# Patient Record
Sex: Male | Born: 1965 | Race: White | Hispanic: No | Marital: Married | Smoking: Never smoker
Health system: Southern US, Community
[De-identification: ages and names within clinical notes are randomized; demographics above are authoritative.]

## PROBLEM LIST (undated history)

## (undated) DIAGNOSIS — I10 Essential (primary) hypertension: Secondary | ICD-10-CM

## (undated) DIAGNOSIS — K219 Gastro-esophageal reflux disease without esophagitis: Secondary | ICD-10-CM

## (undated) DIAGNOSIS — M5416 Radiculopathy, lumbar region: Secondary | ICD-10-CM

## (undated) DIAGNOSIS — C439 Malignant melanoma of skin, unspecified: Secondary | ICD-10-CM

## (undated) DIAGNOSIS — E785 Hyperlipidemia, unspecified: Secondary | ICD-10-CM

## (undated) DIAGNOSIS — N529 Male erectile dysfunction, unspecified: Secondary | ICD-10-CM

## (undated) DIAGNOSIS — J189 Pneumonia, unspecified organism: Secondary | ICD-10-CM

## (undated) HISTORY — DX: Male erectile dysfunction, unspecified: N52.9

## (undated) HISTORY — PX: WISDOM TOOTH EXTRACTION: SHX21

## (undated) HISTORY — DX: Malignant melanoma of skin, unspecified: C43.9

## (undated) HISTORY — DX: Hyperlipidemia, unspecified: E78.5

## (undated) HISTORY — DX: Essential (primary) hypertension: I10

---

## 1995-12-05 HISTORY — PX: HERNIA REPAIR: SHX51

## 2002-12-04 HISTORY — PX: MELANOMA EXCISION: SHX5266

## 2003-12-05 HISTORY — PX: LIPOMA EXCISION: SHX5283

## 2005-12-04 HISTORY — PX: OTHER SURGICAL HISTORY: SHX169

## 2006-08-27 ENCOUNTER — Ambulatory Visit: Payer: Self-pay | Admitting: Internal Medicine

## 2006-10-08 ENCOUNTER — Ambulatory Visit: Payer: Self-pay | Admitting: Internal Medicine

## 2006-10-08 LAB — CONVERTED CEMR LAB
HDL: 44 mg/dL
Total CHOL/HDL Ratio: 4.9

## 2006-11-07 ENCOUNTER — Encounter: Payer: Self-pay | Admitting: Internal Medicine

## 2006-11-07 DIAGNOSIS — J309 Allergic rhinitis, unspecified: Secondary | ICD-10-CM | POA: Insufficient documentation

## 2006-11-07 DIAGNOSIS — I10 Essential (primary) hypertension: Secondary | ICD-10-CM

## 2006-11-07 DIAGNOSIS — E785 Hyperlipidemia, unspecified: Secondary | ICD-10-CM

## 2007-01-09 ENCOUNTER — Ambulatory Visit: Payer: Self-pay | Admitting: Internal Medicine

## 2007-01-10 ENCOUNTER — Encounter (INDEPENDENT_AMBULATORY_CARE_PROVIDER_SITE_OTHER): Payer: Self-pay | Admitting: Internal Medicine

## 2007-01-10 LAB — CONVERTED CEMR LAB
ALT: 34 units/L (ref 0–53)
AST: 19 units/L (ref 0–37)
Albumin: 4.7 g/dL (ref 3.5–5.2)
Alkaline Phosphatase: 65 units/L (ref 39–117)
Basophils Absolute: 0.1 10*3/uL (ref 0.0–0.1)
Basophils Relative: 1 % (ref 0–1)
Calcium: 9.4 mg/dL (ref 8.4–10.5)
Chloride: 102 meq/L (ref 96–112)
Eosinophils Absolute: 0.2 10*3/uL (ref 0.0–0.7)
MCHC: 33.3 g/dL (ref 30.0–36.0)
MCV: 86.2 fL (ref 78.0–100.0)
Monocytes Relative: 7 % (ref 3–11)
Neutro Abs: 3.9 10*3/uL (ref 1.7–7.7)
Neutrophils Relative %: 57 % (ref 43–77)
Platelets: 223 10*3/uL (ref 150–400)
Potassium: 3.8 meq/L (ref 3.5–5.3)
RBC: 5.36 M/uL (ref 4.22–5.81)
Sodium: 140 meq/L (ref 135–145)
Total Protein: 7.4 g/dL (ref 6.0–8.3)

## 2007-01-28 ENCOUNTER — Telehealth (INDEPENDENT_AMBULATORY_CARE_PROVIDER_SITE_OTHER): Payer: Self-pay | Admitting: *Deleted

## 2007-04-09 ENCOUNTER — Encounter (INDEPENDENT_AMBULATORY_CARE_PROVIDER_SITE_OTHER): Payer: Self-pay | Admitting: Internal Medicine

## 2007-04-10 ENCOUNTER — Ambulatory Visit: Payer: Self-pay | Admitting: Internal Medicine

## 2007-04-10 DIAGNOSIS — F528 Other sexual dysfunction not due to a substance or known physiological condition: Secondary | ICD-10-CM

## 2007-07-12 ENCOUNTER — Ambulatory Visit: Payer: Self-pay | Admitting: Internal Medicine

## 2007-07-14 LAB — CONVERTED CEMR LAB
Albumin: 4.4 g/dL (ref 3.5–5.2)
BUN: 18 mg/dL (ref 6–23)
CO2: 24 meq/L (ref 19–32)
Cholesterol: 152 mg/dL (ref 0–200)
Eosinophils Absolute: 0.2 10*3/uL (ref 0.0–0.7)
Eosinophils Relative: 4 % (ref 0–5)
Glucose, Bld: 85 mg/dL (ref 70–99)
HCT: 47.3 % (ref 39.0–52.0)
HDL: 48 mg/dL (ref >39)
Hemoglobin: 16.4 g/dL (ref 13.0–17.0)
Lymphocytes Relative: 32 % (ref 12–46)
Lymphs Abs: 1.7 10*3/uL (ref 0.7–3.3)
MCV: 85.2 fL (ref 78.0–100.0)
Monocytes Relative: 8 % (ref 3–11)
Platelets: 212 10*3/uL (ref 150–400)
RBC: 5.55 M/uL (ref 4.22–5.81)
Sodium: 142 meq/L (ref 135–145)
Total Bilirubin: 0.7 mg/dL (ref 0.3–1.2)
Total Protein: 7 g/dL (ref 6.0–8.3)
Triglycerides: 82 mg/dL (ref <150)
WBC: 5.4 10*3/uL (ref 4.0–10.5)

## 2007-07-15 ENCOUNTER — Telehealth (INDEPENDENT_AMBULATORY_CARE_PROVIDER_SITE_OTHER): Payer: Self-pay | Admitting: *Deleted

## 2007-07-15 ENCOUNTER — Encounter (INDEPENDENT_AMBULATORY_CARE_PROVIDER_SITE_OTHER): Payer: Self-pay | Admitting: Internal Medicine

## 2009-04-12 ENCOUNTER — Encounter: Payer: Self-pay | Admitting: Internal Medicine

## 2009-05-17 ENCOUNTER — Ambulatory Visit: Payer: Self-pay | Admitting: Internal Medicine

## 2009-05-17 DIAGNOSIS — R9431 Abnormal electrocardiogram [ECG] [EKG]: Secondary | ICD-10-CM

## 2011-05-29 ENCOUNTER — Encounter: Payer: Self-pay | Admitting: Internal Medicine

## 2013-04-04 ENCOUNTER — Ambulatory Visit (INDEPENDENT_AMBULATORY_CARE_PROVIDER_SITE_OTHER): Payer: BC Managed Care – PPO | Admitting: Urology

## 2013-04-04 DIAGNOSIS — R3129 Other microscopic hematuria: Secondary | ICD-10-CM

## 2013-07-18 ENCOUNTER — Ambulatory Visit (INDEPENDENT_AMBULATORY_CARE_PROVIDER_SITE_OTHER): Payer: BC Managed Care – PPO | Admitting: Urology

## 2013-07-18 ENCOUNTER — Other Ambulatory Visit: Payer: Self-pay | Admitting: Urology

## 2013-07-18 DIAGNOSIS — R319 Hematuria, unspecified: Secondary | ICD-10-CM

## 2013-07-18 DIAGNOSIS — R3129 Other microscopic hematuria: Secondary | ICD-10-CM

## 2013-08-01 ENCOUNTER — Ambulatory Visit (HOSPITAL_COMMUNITY)
Admission: RE | Admit: 2013-08-01 | Discharge: 2013-08-01 | Disposition: A | Discharge status: Home / Self Care | Payer: BC Managed Care – PPO | Source: Ambulatory Visit | Attending: Urology | Admitting: Urology

## 2013-08-01 DIAGNOSIS — K5730 Diverticulosis of large intestine without perforation or abscess without bleeding: Secondary | ICD-10-CM | POA: Insufficient documentation

## 2013-08-01 DIAGNOSIS — K8020 Calculus of gallbladder without cholecystitis without obstruction: Secondary | ICD-10-CM | POA: Insufficient documentation

## 2013-08-01 DIAGNOSIS — R319 Hematuria, unspecified: Secondary | ICD-10-CM

## 2013-08-01 DIAGNOSIS — Z85820 Personal history of malignant melanoma of skin: Secondary | ICD-10-CM | POA: Insufficient documentation

## 2013-08-01 MED ORDER — IOHEXOL 300 MG/ML  SOLN
125.0000 mL | Freq: Once | INTRAMUSCULAR | Status: AC | PRN
  Administered 2013-08-01: 125 mL via INTRAVENOUS

## 2013-08-15 ENCOUNTER — Ambulatory Visit (INDEPENDENT_AMBULATORY_CARE_PROVIDER_SITE_OTHER): Payer: BC Managed Care – PPO | Admitting: Urology

## 2013-08-15 DIAGNOSIS — R3129 Other microscopic hematuria: Secondary | ICD-10-CM

## 2013-08-15 DIAGNOSIS — N281 Cyst of kidney, acquired: Secondary | ICD-10-CM

## 2014-04-28 IMAGING — CT CT ABD-PEL WO/W CM
3 of 10 series · 12 of 46 positions shown, 18 images · IV contrast (Omnipaque 300)
Comparison: None.

CLINICAL DATA: Hematuria.  Personal history of melanoma.

EXAM:
CT ABDOMEN AND PELVIS WITHOUT AND WITH CONTRAST
TECHNIQUE: Multidetector CT imaging of the abdomen and pelvis was performed
without contrast material in one or both body regions, followed by
contrast material(s) and further sections in one or both body
regions.
CONTRAST:  125mL OMNIPAQUE IOHEXOL 300 MG/ML  SOLN

[Series 2: hematuria pre 5.0 b40f · axial · non-contrast · 0.71mm/px · z∈[-450,-80]mm · 6 of 104 slices shown, 11 images]
[im 15/104  soft-tissue]
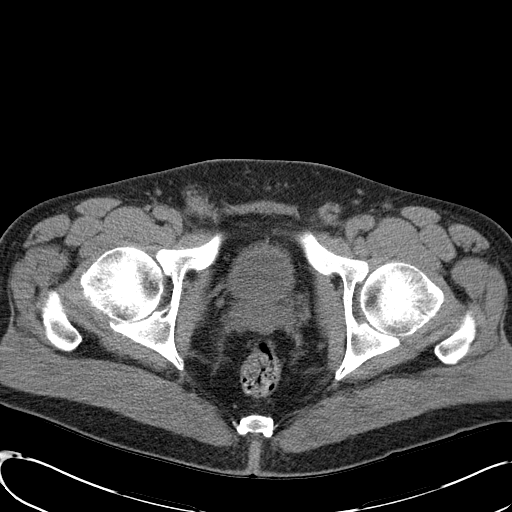
[im 15/104  bone]
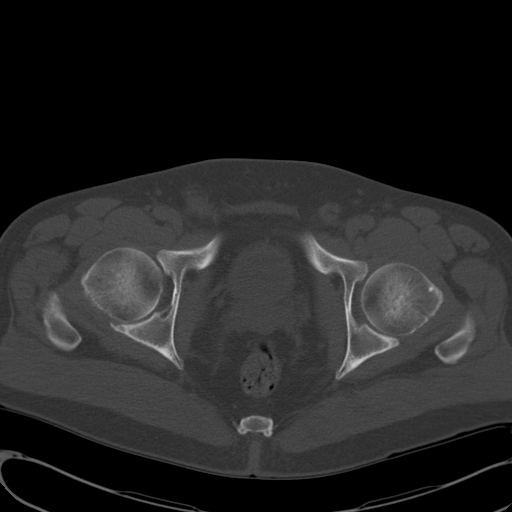
[im 30/104  soft-tissue]
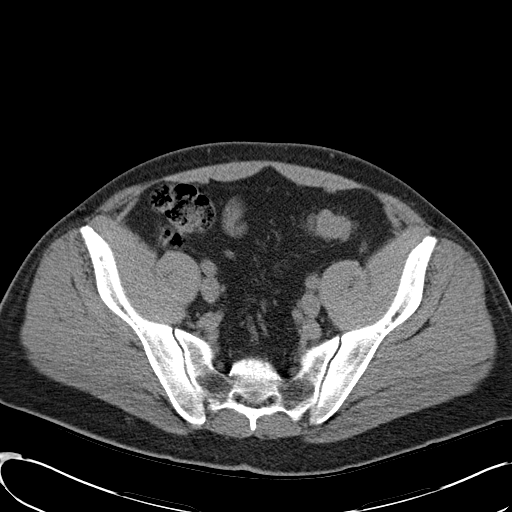
[im 45/104  soft-tissue]
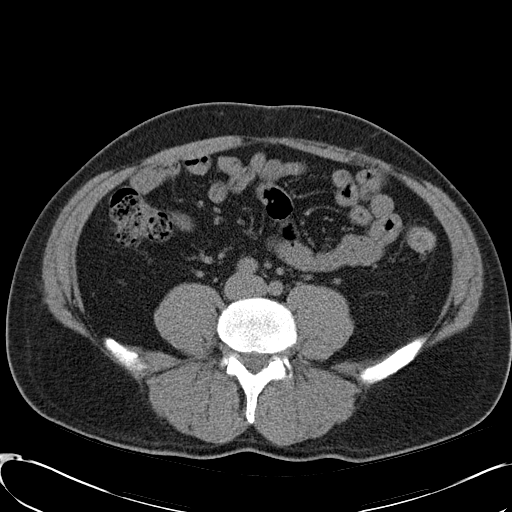
[im 45/104  lung]
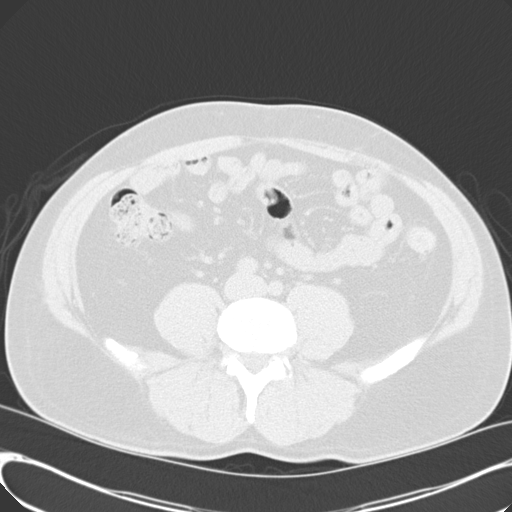
[im 59/104  soft-tissue]
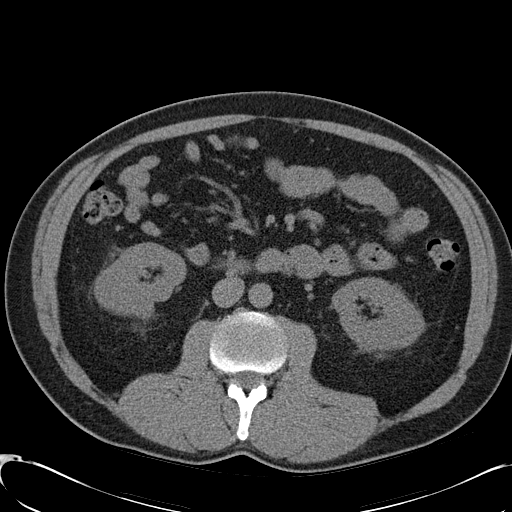
[im 59/104  lung]
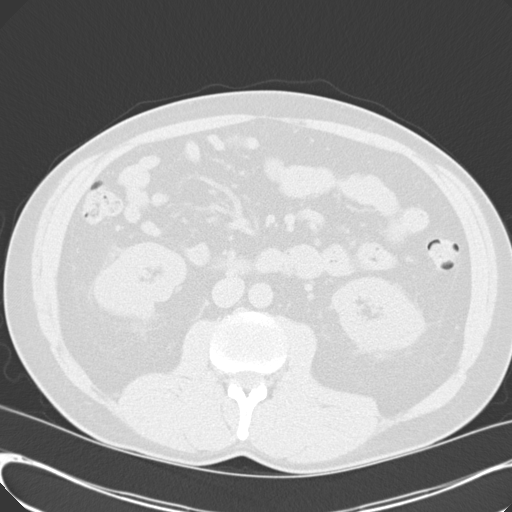
[im 74/104  soft-tissue]
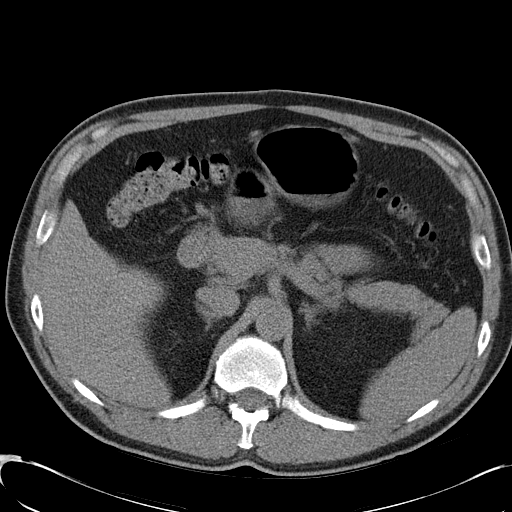
[im 74/104  lung]
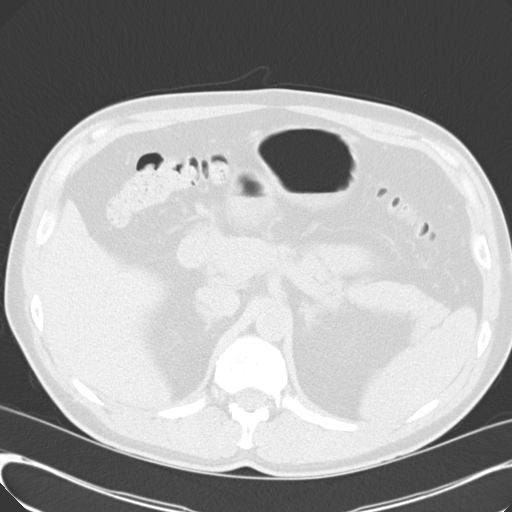
[im 89/104  soft-tissue]
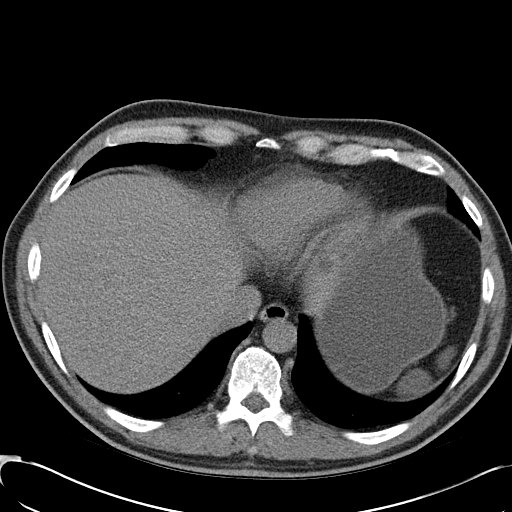
[im 89/104  lung]
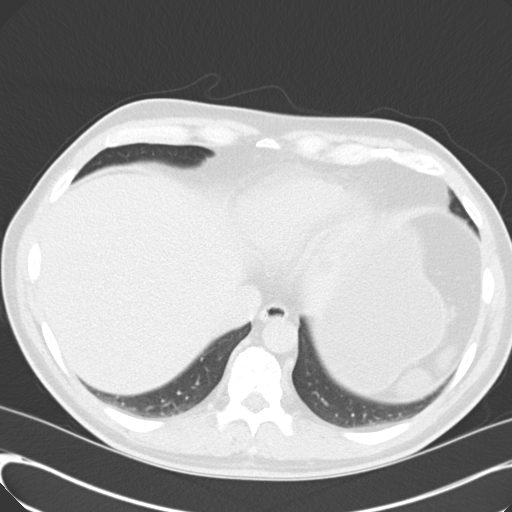

[Series 3: hematuria mpr pre coronal 3.0 · coronal · non-contrast · 0.73mm/px · 2 of 95 slices shown, 3 images]
[im 32/95  soft-tissue]
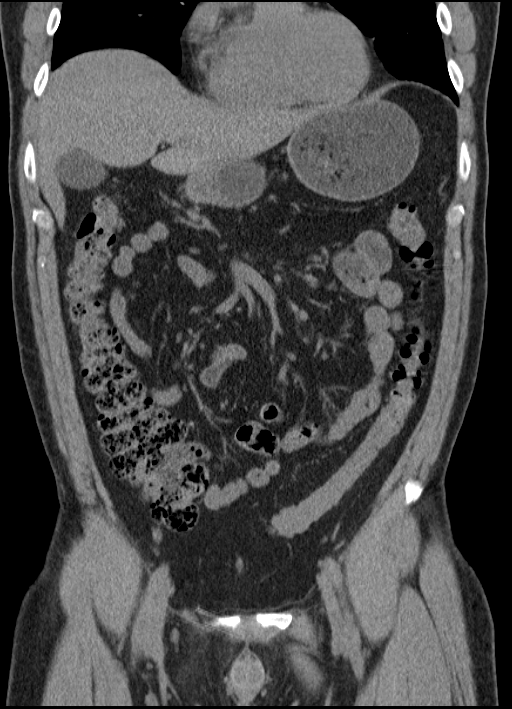
[im 32/95  bone]
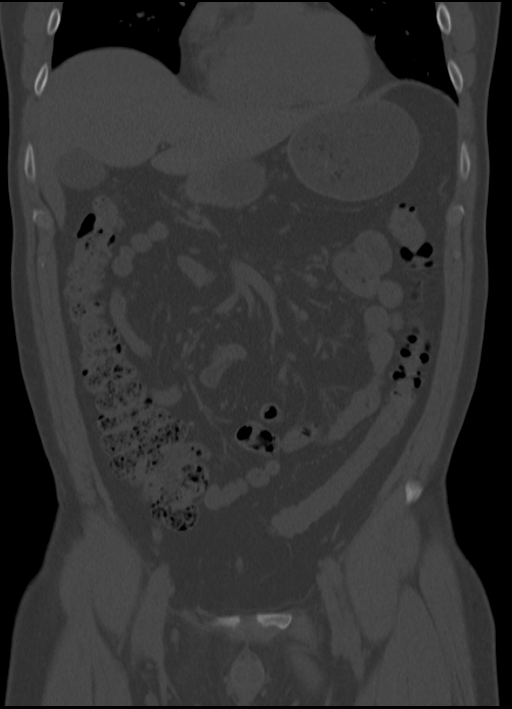
[im 63/95  soft-tissue]
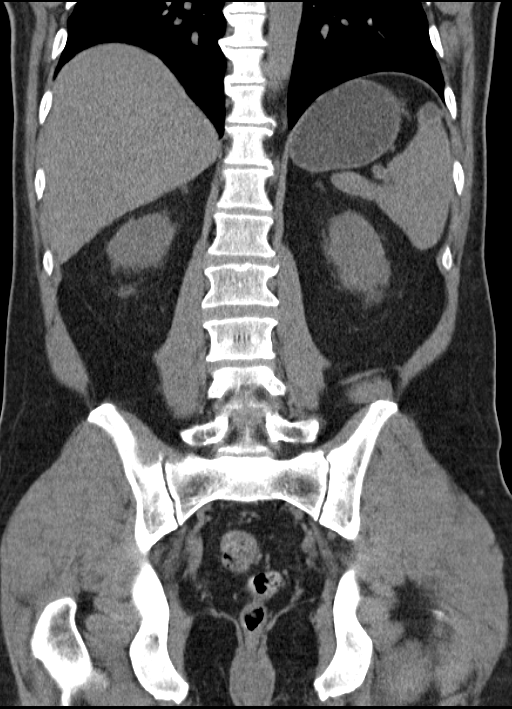

[Series 6: hematuria post axial 5.0 b40f · axial · 0.71mm/px · z∈[-455,-230]mm · 4 of 105 slices shown]
[im 15/105  soft-tissue]
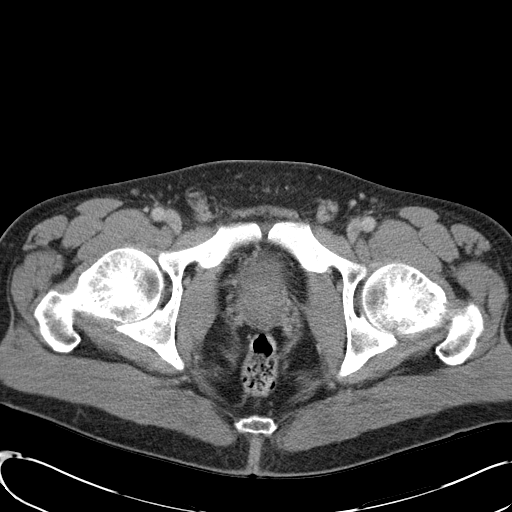
[im 30/105  soft-tissue]
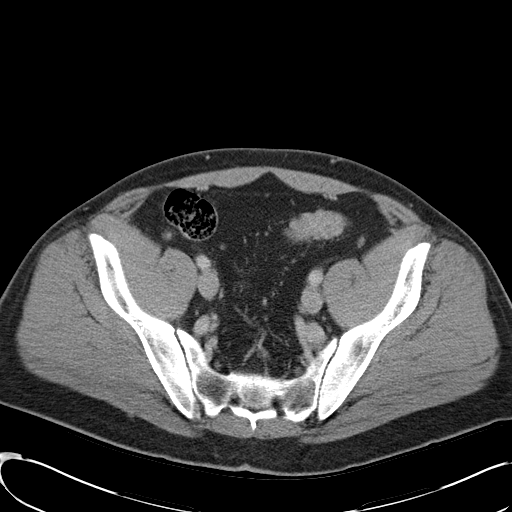
[im 45/105  soft-tissue]
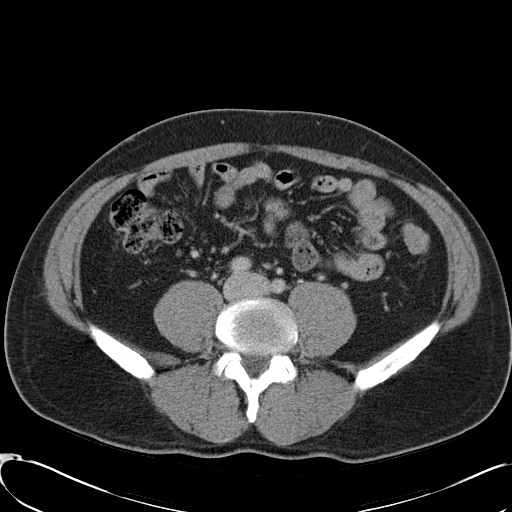
[im 60/105  soft-tissue]
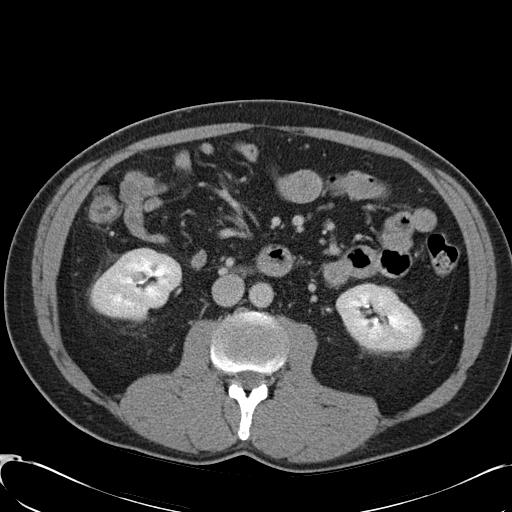

[12 of 46 positions shown; findings below may reference images not displayed]

FINDINGS: No evidence of renal calculi or hydronephrosis. No evidence of
ureteral calculi or dilatation. No bladder calculi identified.
Incidental note is made of tiny gallstones in the gallbladder neck,
without evidence of cholecystitis.

Postcontrast phases show a simple subcapsular cyst in the lower pole
of the left kidney which measures 1.8 cm. No complex cystic or solid
renal masses are identified. Excretory phase shows no evidence of
masses or filling defects involving the renal collecting systems,
opacified portions of ureters, or urinary bladder. Mild median lobe
hypertrophy of prostate causes mild mass effect on bladder base.

A 1 cm benign appearing cyst is seen in the superior lateral segment
of the left hepatic lobe on image 15, and other small benign
appearing cysts are also seen in the superior aspect of the spleen.
No evidence of splenomegaly. The pancreas and adrenal glands are
normal in appearance.

No other soft tissue masses or lymphadenopathy identified within the
abdomen or pelvis. No evidence of inflammatory process or abnormal
fluid collections. No evidence of dilated bowel loops or hernia.
Mild diverticulosis is seen involving the left colon. No evidence of
diverticulitis.
IMPRESSION: No evidence of urinary tract neoplasm, urolithiasis, or
hydronephrosis.

Cholelithiasis and diverticulosis incidentally noted.

## 2014-11-12 ENCOUNTER — Telehealth: Payer: Self-pay | Admitting: Gastroenterology

## 2014-11-12 NOTE — Telephone Encounter (Signed)
PT WILL CALL FOR APPT FOR TCS-PREPOPIK AFTER Dec 14, 2014. PT MAY WANT OPV PRIOR TO TCS.

## 2014-11-18 ENCOUNTER — Telehealth: Payer: Self-pay

## 2014-11-18 NOTE — Telephone Encounter (Addendum)
Gastroenterology Pre-Procedure Review  Request Date: Requesting Physician: Modena Morrow  PATIENT REVIEW QUESTIONS: The patient responded to the following health history questions as indicated:    1. Diabetes Melitis: NO 2. Joint replacements in the past 12 months: NO 3. Major health problems in the past 3 months: NO 4. Has an artificial valve or MVP: NO 5. Has a defibrillator: NO 6. Has been advised in past to take antibiotics in advance of a procedure like teeth cleaning: NO 7.Family History Colon Cancer: NO 8. Alcohol:  1 BEER A MONTH    MEDICATIONS & ALLERGIES:    Patient reports the following regarding taking any blood thinners:   Plavix? NO Aspirin? YES Coumadin? NO  Patient confirms/reports the following medications:  Current Outpatient Prescriptions  Medication Sig Dispense Refill  . atorvastatin (LIPITOR) 10 MG tablet Take 10 mg by mouth daily.      Marland Kitchen desloratadine (CLARINEX) 5 MG tablet Take 5 mg by mouth daily as needed.      Marland Kitchen losartan-hydrochlorothiazide (HYZAAR) 50-12.5 MG per tablet Take 1 tablet by mouth daily.      . mometasone (NASONEX) 50 MCG/ACT nasal spray Place 2 sprays into the nose as needed.      . Multiple Vitamin (MULTIVITAMIN) tablet Take 1 tablet by mouth. Every now and then     . vardenafil (LEVITRA) 10 MG tablet Take 10 mg by mouth daily as needed.       No current facility-administered medications for this visit.    Patient confirms/reports the following allergies:  Allergies not on file  No orders of the defined types were placed in this encounter.    AUTHORIZATION INFORMATION Primary Insurance: [place],  [place] #: [id number]  Group #:  Pre-Cert / Josem Kaufmann required:  Pre-Cert / Auth #:    SCHEDULE INFORMATION: Procedure has been scheduled as follows:  Date: , Time:  Location:   This Gastroenterology Pre-Precedure Review Form is being routed to the following provider(s):

## 2014-11-18 NOTE — Addendum Note (Signed)
Addended by: Marlou Porch on: 11/18/2014 12:00 PM   Modules accepted: Medications

## 2014-11-18 NOTE — Telephone Encounter (Signed)
PATIENT TURNING 49, NO PROBLEMS. WIFE CALLED TO SCHEDULE HIM A COLONOSCOPY.   WANTS TO BE A FIELDS PATIENT  [phone] OR [phone](HOME)

## 2014-11-19 NOTE — Addendum Note (Signed)
Addended by: Marlou Porch on: 11/19/2014 08:50 AM   Modules accepted: Medications

## 2014-11-20 NOTE — Telephone Encounter (Signed)
Called and left message on machine to call back. I also called [employer] and talked with Didi F. He does not need a PA for the TCS.

## 2014-11-20 NOTE — Telephone Encounter (Signed)
Appropriate.

## 2014-11-30 NOTE — Telephone Encounter (Signed)
Pt is wanting to wait until February, but he will call back with some dates

## 2015-01-27 ENCOUNTER — Ambulatory Visit (INDEPENDENT_AMBULATORY_CARE_PROVIDER_SITE_OTHER): Payer: BLUE CROSS/BLUE SHIELD | Admitting: Gastroenterology

## 2015-01-27 ENCOUNTER — Other Ambulatory Visit: Payer: Self-pay

## 2015-01-27 ENCOUNTER — Encounter: Payer: Self-pay | Admitting: Gastroenterology

## 2015-01-27 VITALS — BP 139/85 | HR 67 | Temp 98.1°F | Ht 73.0 in | Wt 211.0 lb

## 2015-01-27 DIAGNOSIS — K2270 Barrett's esophagus without dysplasia: Secondary | ICD-10-CM

## 2015-01-27 DIAGNOSIS — Z1211 Encounter for screening for malignant neoplasm of colon: Secondary | ICD-10-CM

## 2015-01-27 NOTE — Progress Notes (Signed)
   Subjective:    Patient ID: Logan Hodge, male    DOB: 02/25/1966, 49 y.o.   MRN: [id number]  Florina Ou, MD  HPI Brother had polyps. Turned 49 and need colonoscopy. BMs: DAILY.  PT DENIES FEVER, CHILLS, HEMATOCHEZIA, nausea, vomiting, melena, diarrhea, CHEST PAIN, SHORTNESS OF BREATH,  CHANGE IN BOWEL IN HABITS, constipation, abdominal pain, problems swallowing, problems with sedation, OR heartburn or indigestion.  Past Medical History  Diagnosis Date  . Allergic rhinitis   . HLD (hyperlipidemia)   . HTN (hypertension)   . Melanoma     hx  . ED (erectile dysfunction)    Past Surgical History  Procedure Laterality Date  . Lasik  2007  . Hernia repair  1997  . Lipoma excision  2005    L arm  . Melanoma excision  2004    right shoulder    No Known Allergies  Current Outpatient Prescriptions  Medication Sig Dispense Refill  . amLODipine (NORVASC) 2.5 MG tablet Take 2.5 mg by mouth daily.    Marland Kitchen atorvastatin (LIPITOR) 10 MG tablet Take 10 mg by mouth daily.      Marland Kitchen desloratadine (CLARINEX) 5 MG tablet Take 5 mg by mouth daily as needed.      Marland Kitchen losartan-hydrochlorothiazide (HYZAAR) 50-12.5 MG per tablet Take 1 tablet by mouth daily.      Marland Kitchen UNABLE TO FIND Althagan-P 1 drop in each eye as needed     Family History  Problem Relation Age of Onset  . Colon polyps Brother   . Colon cancer Neg Hx   . Stomach cancer Neg Hx     History  Substance Use Topics  . Smoking status: Never Smoker   . Smokeless tobacco: Not on file  . Alcohol Use: No      Review of Systems PER HPI OTHERWISE ALL SYSTEMS ARE NEGATIVE.    Objective:   Physical Exam  Constitutional: He is oriented to person, place, and time. He appears well-developed and well-nourished. No distress.  HENT:  Head: Normocephalic and atraumatic.  Mouth/Throat: Oropharynx is clear and moist. No oropharyngeal exudate.  Eyes: Pupils are equal, round, and reactive to light. No scleral icterus.  Neck: Normal range of  motion. Neck supple.  Cardiovascular: Normal rate, regular rhythm and normal heart sounds.   Pulmonary/Chest: Effort normal and breath sounds normal. No respiratory distress.  Abdominal: Soft. Bowel sounds are normal. He exhibits no distension. There is no tenderness.  Musculoskeletal: He exhibits no edema.  Lymphadenopathy:    He has no cervical adenopathy.  Neurological: He is alert and oriented to person, place, and time.  NO FOCAL DEFICITS   Psychiatric: He has a normal mood and affect.  Vitals reviewed.         Assessment & Plan:

## 2015-01-27 NOTE — Assessment & Plan Note (Signed)
High risk-brother with large polyps  TCS FOR SCREENING EGD FOR BARRETT'S SURVEILLANCE OPV TBS SCHEDULED AFTER ENDOSCOPY.

## 2015-01-27 NOTE — Patient Instructions (Signed)
MAR 24: FULL LIQUID DIET. SEE INFO BELOW.  MAR 25:COLONSCOPY TO SCREEN FOR COLON POLYPS. UPPER ENDOSCOPY TO SCREEN FOR BARRETT'S ESOPHAGUS.   Full Liquid Diet A high-calorie, high-protein supplement should be used to meet your nutritional requirements when the full liquid diet is continued for more than 2 or 3 days. If this diet is to be used for an extended period of time (more than 7 days), a multivitamin should be considered.  Breads and Starches  Allowed: None are allowed except crackers WHOLE OR pureed (made into a thick, smooth soup) in soup.   Avoid: Any others.    Potatoes/Pasta/Rice  Allowed: ANY ITEM AS A SOUP OR SMALL PLATE OF MASHED POTATOES.       Vegetables  Allowed: Strained tomato or vegetable juice. Vegetables pureed in soup.   Avoid: Any others.    Fruit  Allowed: Any strained fruit juices and fruit drinks. Include 1 serving of citrus or vitamin C-enriched fruit juice daily.   Avoid: Any others.  Meat and Meat Substitutes  Allowed: Egg  Avoid: Any meat, fish, or fowl. All cheese.  Milk  Allowed: Milk beverages, including milk shakes and instant breakfast mixes. Smooth yogurt.   Avoid: Any others. Avoid dairy products if not tolerated.    Soups and Combination Foods  Allowed: Broth, strained cream soups. Strained, broth-based soups.   Avoid: Any others.    Desserts and Sweets  Allowed: flavored gelatin,plain ice cream, sherbet, smooth pudding, junket, fruit ices, frozen ice pops, pudding pops,, frozen fudge pops, chocolate syrup. Sugar, honey, jelly, syrup.   Avoid: Any others.  Fats and Oils  Allowed: Margarine, butter, cream, sour cream, oils.   Avoid: Any others.  Beverages  Allowed: All.   Avoid: None.  Condiments  Allowed: Iodized salt, pepper, spices, flavorings. Cocoa powder.   Avoid: Any others.    SAMPLE MEAL PLAN Breakfast   cup orange juice.   1 OR 2 EGGS   1 cup  milk.   1 cup beverage (coffee or tea).    Cream or sugar, if desired.    Midmorning Snack  2 SCRAMBLED OR HARD BOILED EGG   Lunch  1 cup cream soup.    cup fruit juice.   1 cup milk.    cup custard.   1 cup beverage (coffee or tea).   Cream or sugar, if desired.    Midafternoon Snack  1 cup milk shake.  Dinner  1 cup cream soup.    cup fruit juice.   1 cup milk.    cup pudding.   1 cup beverage (coffee or tea).   Cream or sugar, if desired.  Evening Snack  1 cup supplement.  To increase calories, add sugar, cream, butter, or margarine if possible. Nutritional supplements will also increase the total calories.

## 2015-02-02 NOTE — Progress Notes (Signed)
CC'ED TO PCP 

## 2015-02-25 ENCOUNTER — Encounter (HOSPITAL_COMMUNITY)
Admission: RE | Disposition: A | Discharge status: Home / Self Care | Payer: Self-pay | Source: Ambulatory Visit | Attending: Gastroenterology

## 2015-02-25 ENCOUNTER — Encounter (HOSPITAL_COMMUNITY): Payer: Self-pay | Admitting: *Deleted

## 2015-02-25 ENCOUNTER — Ambulatory Visit (HOSPITAL_COMMUNITY)
Admission: RE | Admit: 2015-02-25 | Discharge: 2015-02-25 | Disposition: A | Discharge status: Home / Self Care | Payer: BLUE CROSS/BLUE SHIELD | Source: Ambulatory Visit | Attending: Gastroenterology | Admitting: Gastroenterology

## 2015-02-25 DIAGNOSIS — N529 Male erectile dysfunction, unspecified: Secondary | ICD-10-CM | POA: Diagnosis not present

## 2015-02-25 DIAGNOSIS — Z85820 Personal history of malignant melanoma of skin: Secondary | ICD-10-CM | POA: Diagnosis not present

## 2015-02-25 DIAGNOSIS — D124 Benign neoplasm of descending colon: Secondary | ICD-10-CM | POA: Diagnosis not present

## 2015-02-25 DIAGNOSIS — K2270 Barrett's esophagus without dysplasia: Secondary | ICD-10-CM

## 2015-02-25 DIAGNOSIS — I10 Essential (primary) hypertension: Secondary | ICD-10-CM | POA: Insufficient documentation

## 2015-02-25 DIAGNOSIS — K219 Gastro-esophageal reflux disease without esophagitis: Secondary | ICD-10-CM | POA: Insufficient documentation

## 2015-02-25 DIAGNOSIS — K5730 Diverticulosis of large intestine without perforation or abscess without bleeding: Secondary | ICD-10-CM | POA: Diagnosis not present

## 2015-02-25 DIAGNOSIS — Z1211 Encounter for screening for malignant neoplasm of colon: Secondary | ICD-10-CM | POA: Diagnosis not present

## 2015-02-25 DIAGNOSIS — K2980 Duodenitis without bleeding: Secondary | ICD-10-CM | POA: Insufficient documentation

## 2015-02-25 DIAGNOSIS — Z79899 Other long term (current) drug therapy: Secondary | ICD-10-CM | POA: Diagnosis not present

## 2015-02-25 DIAGNOSIS — E785 Hyperlipidemia, unspecified: Secondary | ICD-10-CM | POA: Diagnosis not present

## 2015-02-25 DIAGNOSIS — K644 Residual hemorrhoidal skin tags: Secondary | ICD-10-CM | POA: Insufficient documentation

## 2015-02-25 DIAGNOSIS — Z8371 Family history of colonic polyps: Secondary | ICD-10-CM | POA: Diagnosis not present

## 2015-02-25 DIAGNOSIS — K259 Gastric ulcer, unspecified as acute or chronic, without hemorrhage or perforation: Secondary | ICD-10-CM | POA: Insufficient documentation

## 2015-02-25 HISTORY — PX: COLONOSCOPY: SHX5424

## 2015-02-25 HISTORY — PX: ESOPHAGOGASTRODUODENOSCOPY: SHX5428

## 2015-02-25 SURGERY — COLONOSCOPY
Anesthesia: Moderate Sedation

## 2015-02-25 MED ORDER — STERILE WATER FOR IRRIGATION IR SOLN
Status: DC | PRN
  Administered 2015-02-25: 09:00:00

## 2015-02-25 MED ORDER — MIDAZOLAM HCL 5 MG/5ML IJ SOLN
INTRAMUSCULAR | Status: AC
  Filled 2015-02-25: qty 10

## 2015-02-25 MED ORDER — MEPERIDINE HCL 100 MG/ML IJ SOLN
INTRAMUSCULAR | Status: AC
  Filled 2015-02-25: qty 2

## 2015-02-25 MED ORDER — LIDOCAINE VISCOUS 2 % MT SOLN
OROMUCOSAL | Status: AC
  Filled 2015-02-25: qty 15

## 2015-02-25 MED ORDER — LIDOCAINE VISCOUS 2 % MT SOLN
OROMUCOSAL | Status: DC | PRN
  Administered 2015-02-25: 1 via OROMUCOSAL

## 2015-02-25 MED ORDER — OMEPRAZOLE 20 MG PO CPDR: DELAYED_RELEASE_CAPSULE | ORAL | Status: DC

## 2015-02-25 MED ORDER — MIDAZOLAM HCL 5 MG/5ML IJ SOLN
INTRAMUSCULAR | Status: DC | PRN
  Administered 2015-02-25 (×2): 2 mg via INTRAVENOUS
  Administered 2015-02-25: 1 mg via INTRAVENOUS
  Administered 2015-02-25: 2 mg via INTRAVENOUS
  Administered 2015-02-25: 1 mg via INTRAVENOUS
  Administered 2015-02-25: 2 mg via INTRAVENOUS

## 2015-02-25 MED ORDER — MEPERIDINE HCL 100 MG/ML IJ SOLN
INTRAMUSCULAR | Status: DC | PRN
  Administered 2015-02-25: 25 mg via INTRAVENOUS
  Administered 2015-02-25: 50 mg via INTRAVENOUS
  Administered 2015-02-25 (×3): 25 mg via INTRAVENOUS

## 2015-02-25 MED ORDER — SODIUM CHLORIDE 0.9 % IV SOLN
INTRAVENOUS | Status: DC
  Administered 2015-02-25: 09:00:00 via INTRAVENOUS

## 2015-02-25 NOTE — Interval H&P Note (Signed)
History and Physical Interval Note:  02/25/2015 9:11 AM  Logan Hodge  has presented today for surgery, with the diagnosis of screen for barretts and colon cancer  The various methods of treatment have been discussed with the patient and family. After consideration of risks, benefits and other options for treatment, the patient has consented to  Procedure(s) with comments: COLONOSCOPY (N/A) - 915am - moved to 3/24 same time - Candy notified pt ESOPHAGOGASTRODUODENOSCOPY (EGD) (N/A) as a surgical intervention .  The patient's history has been reviewed, patient examined, no change in status, stable for surgery.  I have reviewed the patient's chart and labs.  Questions were answered to the patient's satisfaction.     [employer]

## 2015-02-25 NOTE — H&P (View-Only) (Signed)
   Subjective:    Patient ID: Logan Hodge, male    DOB: 1966/07/26, 49 y.o.   MRN: [id number]  Florina Ou, MD  HPI Brother had polyps. Turned 49 and need colonoscopy. BMs: DAILY.  PT DENIES FEVER, CHILLS, HEMATOCHEZIA, nausea, vomiting, melena, diarrhea, CHEST PAIN, SHORTNESS OF BREATH,  CHANGE IN BOWEL IN HABITS, constipation, abdominal pain, problems swallowing, problems with sedation, OR heartburn or indigestion.  Past Medical History  Diagnosis Date  . Allergic rhinitis   . HLD (hyperlipidemia)   . HTN (hypertension)   . Melanoma     hx  . ED (erectile dysfunction)    Past Surgical History  Procedure Laterality Date  . Lasik  2007  . Hernia repair  1997  . Lipoma excision  2005    L arm  . Melanoma excision  2004    right shoulder    No Known Allergies  Current Outpatient Prescriptions  Medication Sig Dispense Refill  . amLODipine (NORVASC) 2.5 MG tablet Take 2.5 mg by mouth daily.    Marland Kitchen atorvastatin (LIPITOR) 10 MG tablet Take 10 mg by mouth daily.      Marland Kitchen desloratadine (CLARINEX) 5 MG tablet Take 5 mg by mouth daily as needed.      Marland Kitchen losartan-hydrochlorothiazide (HYZAAR) 50-12.5 MG per tablet Take 1 tablet by mouth daily.      Marland Kitchen UNABLE TO FIND Althagan-P 1 drop in each eye as needed     Family History  Problem Relation Age of Onset  . Colon polyps Brother   . Colon cancer Neg Hx   . Stomach cancer Neg Hx     History  Substance Use Topics  . Smoking status: Never Smoker   . Smokeless tobacco: Not on file  . Alcohol Use: No      Review of Systems PER HPI OTHERWISE ALL SYSTEMS ARE NEGATIVE.    Objective:   Physical Exam  Constitutional: He is oriented to person, place, and time. He appears well-developed and well-nourished. No distress.  HENT:  Head: Normocephalic and atraumatic.  Mouth/Throat: Oropharynx is clear and moist. No oropharyngeal exudate.  Eyes: Pupils are equal, round, and reactive to light. No scleral icterus.  Neck: Normal range of  motion. Neck supple.  Cardiovascular: Normal rate, regular rhythm and normal heart sounds.   Pulmonary/Chest: Effort normal and breath sounds normal. No respiratory distress.  Abdominal: Soft. Bowel sounds are normal. He exhibits no distension. There is no tenderness.  Musculoskeletal: He exhibits no edema.  Lymphadenopathy:    He has no cervical adenopathy.  Neurological: He is alert and oriented to person, place, and time.  NO FOCAL DEFICITS   Psychiatric: He has a normal mood and affect.  Vitals reviewed.         Assessment & Plan:

## 2015-02-25 NOTE — Op Note (Signed)
[facility] [address] [place], [address]   ENDOSCOPY PROCEDURE REPORT  PATIENT: Logan Hodge, Logan Hodge  MR#: [id number] BIRTHDATE: 10-04-1966 , 49  yrs. old GENDER: male  ENDOSCOPIST: Danie Binder, MD REFERRED IW:OEHOZ Spear, M.D.  PROCEDURE DATE: 03-26-15 PROCEDURE:   EGD w/ biopsy  INDICATIONS:screening for Barrett's. USES EXCEDRIN FOR HAs. MEDICATIONS: TCS+Versed 1 mg IV and Demerol 25 mg IV TOPICAL ANESTHETIC:   Viscous Xylocaine ASA CLASS:  DESCRIPTION OF PROCEDURE:     Physical exam was performed.  Informed consent was obtained from the patient after explaining the benefits, risks, and alternatives to the procedure.  The patient was connected to the monitor and placed in the left lateral position.  Continuous oxygen was provided by nasal cannula and IV medicine administered through an indwelling cannula.  After administration of sedation, the patients esophagus was intubated and the EG-[id number] ([id number])  endoscope was advanced under direct visualization to the second portion of the duodenum.  The scope was removed slowly by carefully examining the color, texture, anatomy, and integrity of the mucosa on the way out.  The patient was recovered in endoscopy and discharged home in satisfactory condition.   ESOPHAGUS: The mucosa of the esophagus appeared normal.   STOMACH: Small non-bleeding, shallow and linear ulcer(s) with surrounding edema were found in the prepyloric region of the stomach.  Biopsies were taken around the ulcer.   DUODENUM: Moderate duodenal inflammation was found in the duodenal bulb.   The duodenal mucosa showed no abnormalities in the 2nd part of the duodenum. COMPLICATIONS: There were no immediate complications.  ENDOSCOPIC IMPRESSION: 1.   NORMAL ESOPHAGUS 2.   Small ulcer(s) in the prepyloric region of the stomach 3.   MODERATE DUODENITIS  RECOMMENDATIONS: START OMEPRAZOLE.  TAKE 30 MINUTES PRIOR TO BREAKFAST AND SUPPER FOR 3 MOS  THEN ONCE DAILY FOR 3 MOS. AWAIT BIOPSY. AVOID ITEMS THAT TRIGGER GASTRITIS. FOLLOW A HIGH FIBER/LOW FAT DIET.  AVOID ITEMS THAT CAUSE BLOATING.  REPEAT EGD IN 3 MOS. Next colonoscopy in 3-5 years.  REPEAT EXAM: eSigned:  Danie Binder, MD 2015/03/26 11:26 AM   CPT CODES: ICD CODES:  The ICD and CPT codes recommended by this software are interpretations from the data that the clinical staff has captured with the software.  The verification of the translation of this report to the ICD and CPT codes and modifiers is the sole responsibility of the health care institution and practicing physician where this report was generated.  [employer] will not be held responsible for the validity of the ICD and CPT codes included on this report.  AMA assumes no liability for data contained or not contained herein. CPT is a Designer, television/film set of the [employer].

## 2015-02-25 NOTE — Discharge Instructions (Signed)
You had 2 polyps removed. You have EXTERNAL hemorrhoids. You have SMALL ULCERS(3) IN YOUR STOMACH & INFLAMMATION IN YOUR SMALL BOWEL(DUODENITIS). YOU DO NOT HAVE BARRETT'S ESOPHAGUS.  I biopsied your stomach.    START OMEPRAZOLE. TAKE 30 MINUTES PRIOR TO BREAKFAST AND SUPPER FOR 3 MOS THEN ONCE DAILY FOR 3 MOS.   AVOID ITEMS THAT TRIGGER GASTRITIS. SEE INFO BELOW.  FOLLOW A HIGH FIBER/LOW FAT DIET. AVOID ITEMS THAT CAUSE BLOATING. SEE INFO BELOW.  YOUR BIOPSY RESULTS WILL BE AVAILABLE IN MY CHART AFTER MAR 29   OR MY OFFICE WILL CONTACT YOU IN 10-14 DAYS WITH YOUR RESULTS.   REPEAT EGD IN 3 MOS.  Next colonoscopy in 3-5 years.    ENDOSCOPY Care After Read the instructions outlined below and refer to this sheet in the next week. These discharge instructions provide you with general information on caring for yourself after you leave the hospital. While your treatment has been planned according to the most current medical practices available, unavoidable complications occasionally occur. If you have any problems or questions after discharge, call DR. Izzy Doubek, [phone].  ACTIVITY  You may resume your regular activity, but move at a slower pace for the next 24 hours.   Take frequent rest periods for the next 24 hours.   Walking will help get rid of the air and reduce the bloated feeling in your belly (abdomen).   No driving for 24 hours (because of the medicine (anesthesia) used during the test).   You may shower.   Do not sign any important legal documents or operate any machinery for 24 hours (because of the anesthesia used during the test).    NUTRITION  Drink plenty of fluids.   You may resume your normal diet as instructed by your doctor.   Begin with a light meal and progress to your normal diet. Heavy or fried foods are harder to digest and may make you feel sick to your stomach (nauseated).   Avoid alcoholic beverages for 24 hours or as instructed.     MEDICATIONS  You may resume your normal medications.   WHAT YOU CAN EXPECT TODAY  Some feelings of bloating in the abdomen.   Passage of more gas than usual.   Spotting of blood in your stool or on the toilet paper  .  IF YOU HAD POLYPS REMOVED DURING THE ENDOSCOPY:  Eat a soft diet IF YOU HAVE NAUSEA, BLOATING, ABDOMINAL PAIN, OR VOMITING.    FINDING OUT THE RESULTS OF YOUR TEST Not all test results are available during your visit. DR. Oneida Alar WILL CALL YOU WITHIN 14 DAYS OF YOUR PROCEDUE WITH YOUR RESULTS. Do not assume everything is normal if you have not heard from DR. Layton Naves, CALL HER OFFICE AT [phone].  SEEK IMMEDIATE MEDICAL ATTENTION AND CALL THE OFFICE: [phone] IF:  You have more than a spotting of blood in your stool.   Your belly is swollen (abdominal distention).   You are nauseated or vomiting.   You have a temperature over 101F.   You have abdominal pain or discomfort that is severe or gets worse throughout the [place].    Ulcer Disease (Peptic Ulcer, Gastric Ulcer, Duodenal Ulcer) You have an ulcer. This may be in your stomach (gastric ulcer) or in the first part of your small bowel, the duodenum (duodenal ulcer). An ulcer is a break in the lining of the stomach or duodenum. The ulcer causes erosion into the deeper tissue.  CAUSES The stomach has a lining to  protect itself from the acid that digests food. The lining can be damaged in two main ways:  The Helico Pylori bacteria (H. Pyolori) can infect the lining of the stomach and cause ulcers.   Nonsteroidal, anti-inflammatory medications (NSAIDS) can cause gastric ulcerations.   Smoking tobacco can increase the acid in the stomach. This can lead to ulcers, and will impair healing of ulcers.  Other factors, such as alcohol use and stress may contribute to ulcer formation.  SYMPTOMS The problems (symptoms) of ulcer disease are usually a burning or gnawing of the mid-upper belly (abdomen).  This is often worse on an empty stomach and may get better with food. This may be associated with feeling sick to your stomach (nausea), bloating, and vomiting.  HOME CARE INSTRUCTIONS Continue regular work and usual activities unless advised otherwise by your caregiver.  Avoid tobacco, alcohol, and caffeine. Tobacco use will decrease and slow the rates of healing.   Avoid foods that seem to aggravate or cause discomfort.   Special diets are not usually needed.     High-Fiber Diet A high-fiber diet changes your normal diet to include more whole grains, legumes, fruits, and vegetables. Changes in the diet involve replacing refined carbohydrates with unrefined foods. The calorie level of the diet is essentially unchanged. The Dietary Reference Intake (recommended amount) for adult males is 38 grams per [place]. For adult females, it is 25 grams per [place]. Pregnant and lactating women should consume 28 grams of fiber per [place]. Fiber is the intact part of a plant that is not broken down during digestion. Functional fiber is fiber that has been isolated from the plant to provide a beneficial effect in the body. PURPOSE  Increase stool bulk.   Ease and regulate bowel movements.   Lower cholesterol.  INDICATIONS THAT YOU NEED MORE FIBER  Constipation and hemorrhoids.   Uncomplicated diverticulosis (intestine condition) and irritable bowel syndrome.   Weight management.   As a protective measure against hardening of the arteries (atherosclerosis), diabetes, and cancer.   GUIDELINES FOR INCREASING FIBER IN THE DIET  Start adding fiber to the diet slowly. A gradual increase of about 5 more grams (2 slices of whole-wheat bread, 2 servings of most fruits or vegetables, or 1 bowl of high-fiber cereal) per [place] is best. Too rapid an increase in fiber may result in constipation, flatulence, and bloating.   Drink enough water and fluids to keep your urine clear or pale yellow. Water, juice, or  caffeine-free drinks are recommended. Not drinking enough fluid may cause constipation.   Eat a variety of high-fiber foods rather than one type of fiber.   Try to increase your intake of fiber through using high-fiber foods rather than fiber pills or supplements that contain small amounts of fiber.   The goal is to change the types of food eaten. Do not supplement your present diet with high-fiber foods, but replace foods in your present diet.  INCLUDE A VARIETY OF FIBER SOURCES  Replace refined and processed grains with whole grains, canned fruits with fresh fruits, and incorporate other fiber sources. White rice, white breads, and most bakery goods contain little or no fiber.   Brown whole-grain rice, buckwheat oats, and many fruits and vegetables are all good sources of fiber. These include: broccoli, Brussels sprouts, cabbage, cauliflower, beets, sweet potatoes, white potatoes (skin on), carrots, tomatoes, eggplant, squash, berries, fresh fruits, and dried fruits.   Cereals appear to be the richest source of fiber. Cereal fiber is found in  whole grains and bran. Bran is the fiber-rich outer coat of cereal grain, which is largely removed in refining. In whole-grain cereals, the bran remains. In breakfast cereals, the largest amount of fiber is found in those with "bran" in their names. The fiber content is sometimes indicated on the label.   You may need to include additional fruits and vegetables each [place].   In baking, for 1 cup white flour, you may use the following substitutions:   1 cup whole-wheat flour minus 2 tablespoons.   1/2 cup white flour plus 1/2 cup whole-wheat flour.   Low-Fat Diet BREADS, CEREALS, PASTA, RICE, DRIED PEAS, AND BEANS These products are high in carbohydrates and most are low in fat. Therefore, they can be increased in the diet as substitutes for fatty foods. They too, however, contain calories and should not be eaten in excess. Cereals can be eaten for  snacks as well as for breakfast.  Include foods that contain fiber (fruits, vegetables, whole grains, and legumes). Research shows that fiber may lower blood cholesterol levels, especially the water-soluble fiber found in fruits, vegetables, oat products, and legumes. FRUITS AND VEGETABLES It is good to eat fruits and vegetables. Besides being sources of fiber, both are rich in vitamins and some minerals. They help you get the daily allowances of these nutrients. Fruits and vegetables can be used for snacks and desserts. MEATS Limit lean meat, chicken, [place], and fish to no more than 6 ounces per [place]. Beef, Pork, and Lamb Use lean cuts of beef, pork, and lamb. Lean cuts include:  Extra-lean ground beef.  Arm roast.  Sirloin tip.  Center-cut ham.  Round steak.  Loin chops.  Rump roast.  Tenderloin.  Trim all fat off the outside of meats before cooking. It is not necessary to severely decrease the intake of red meat, but lean choices should be made. Lean meat is rich in protein and contains a highly absorbable form of iron. Premenopausal women, in particular, should avoid reducing lean red meat because this could increase the risk for low red blood cells (iron-deficiency anemia). The organ meats, such as liver, sweetbreads, kidneys, and brain are very rich in cholesterol. They should be limited. Chicken and [place] These are good sources of protein. The fat of poultry can be reduced by removing the skin and underlying fat layers before cooking. Chicken and [place] can be substituted for lean red meat in the diet. Poultry should not be fried or covered with high-fat sauces. Fish and Shellfish Fish is a good source of protein. Shellfish contain cholesterol, but they usually are low in saturated fatty acids. The preparation of fish is important. Like chicken and [place], they should not be fried or covered with high-fat sauces. EGGS Egg whites contain no fat or cholesterol. They can be eaten often.  Try 1 to 2 egg whites instead of whole eggs in recipes or use egg substitutes that do not contain yolk. MILK AND DAIRY PRODUCTS Use skim or 1% milk instead of 2% or whole milk. Decrease whole milk, natural, and processed cheeses. Use nonfat or low-fat (2%) cottage cheese or low-fat cheeses made from vegetable oils. Choose nonfat or low-fat (1 to 2%) yogurt. Experiment with evaporated skim milk in recipes that call for heavy cream. Substitute low-fat yogurt or low-fat cottage cheese for sour cream in dips and salad dressings. Have at least 2 servings of low-fat dairy products, such as 2 glasses of skim (or 1%) milk each [place] to help get your daily calcium  intake.  FATS AND OILS Reduce the total intake of fats, especially saturated fat. Butterfat, lard, and beef fats are high in saturated fat and cholesterol. These should be avoided as much as possible. Vegetable fats do not contain cholesterol, but certain vegetable fats, such as coconut oil, palm oil, and palm kernel oil are very high in saturated fats. These should be limited. These fats are often used in bakery goods, processed foods, popcorn, oils, and nondairy creamers. Vegetable shortenings and some peanut butters contain hydrogenated oils, which are also saturated fats. Read the labels on these foods and check for saturated vegetable oils. Unsaturated vegetable oils and fats do not raise blood cholesterol. However, they should be limited because they are fats and are high in calories. Total fat should still be limited to 30% of your daily caloric intake. Desirable liquid vegetable oils are corn oil, cottonseed oil, olive oil, canola oil, safflower oil, soybean oil, and sunflower oil. Peanut oil is not as good, but small amounts are acceptable. Buy a heart-healthy tub margarine that has no partially hydrogenated oils in the ingredients. Mayonnaise and salad dressings often are made from unsaturated fats, but they should also be limited because of their  high calorie and fat content. Seeds, nuts, peanut butter, olives, and avocados are high in fat, but the fat is mainly the unsaturated type. These foods should be limited mainly to avoid excess calories and fat. OTHER EATING TIPS Snacks  Most sweets should be limited as snacks. They tend to be rich in calories and fats, and their caloric content outweighs their nutritional value. Some good choices in snacks are graham crackers, melba toast, soda crackers, bagels (no egg), English muffins, fruits, and vegetables. These snacks are preferable to snack crackers, [place] fries, and chips. Popcorn should be air-popped or cooked in small amounts of liquid vegetable oil. Desserts Eat fruit, low-fat yogurt, and fruit ices. AVOID pastries, cake, and cookies. Sherbet, angel food cake, gelatin dessert, frozen low-fat yogurt, or other frozen products that do not contain saturated fat (pure fruit juice bars, frozen ice pops) are also acceptable.  COOKING METHODS Choose those methods that use little or no fat. They include: Poaching.  Braising.  Steaming.  Grilling.  Baking.  Stir-frying.  Broiling.  Microwaving.  Foods can be cooked in a nonstick pan without added fat, or use a nonfat cooking spray in regular cookware. Limit fried foods and avoid frying in saturated fat. Add moisture to lean meats by using water, broth, cooking wines, and other nonfat or low-fat sauces along with the cooking methods mentioned above. Soups and stews should be chilled after cooking. The fat that forms on top after a few hours in the refrigerator should be skimmed off. When preparing meals, avoid using excess salt. Salt can contribute to raising blood pressure in some people. EATING AWAY FROM HOME Order entres, potatoes, and vegetables without sauces or butter. When meat exceeds the size of a deck of cards (3 to 4 ounces), the rest can be taken home for another meal. Choose vegetable or fruit salads and ask for low-calorie salad  dressings to be served on the side. Use dressings sparingly. Limit high-fat toppings, such as bacon, crumbled eggs, cheese, sunflower seeds, and olives. Ask for heart-healthy tub margarine instead of butter.  Hemorrhoids Hemorrhoids are dilated (enlarged) veins around the rectum. Sometimes clots will form in the veins. This makes them swollen and painful. These are called thrombosed hemorrhoids. Causes of hemorrhoids include:  Constipation.   Straining to have  a bowel movement.   HEAVY LIFTING HOME CARE INSTRUCTIONS  Eat a well balanced diet and drink 6 to 8 glasses of water every Kl[place]t to avoid constipation. You may also use a bulk laxative.   Avoid straining to have bowel movements.   Keep anal area dry and clean.   Do not use a donut shaped pillow or sit on the toilet for long periods. This increases blood pooling and pain.   Move your bowels when your body has the urge; this will require less straining and will decrease pain and pressure.

## 2015-02-25 NOTE — Op Note (Signed)
[facility] [address] [place], [address]   COLONOSCOPY PROCEDURE REPORT  PATIENT: Logan, Hodge  MR#: [id number] BIRTHDATE: 02-Mar-1966 , 49  yrs. old GENDER: male ENDOSCOPIST: Danie Binder, MD REFERRED TK:WIOXB Spear, M.D. PROCEDURE DATE:  03-05-15 PROCEDURE:   Colonoscopy with cold biopsy polypectomy and Colonoscopy with snare polypectomy INDICATIONS:patient's family history of colon polyps. MEDICATIONS: Demerol 125 mg IV and Versed 9 mg IV  DESCRIPTION OF PROCEDURE:    Physical exam was performed.  Informed consent was obtained from the patient after explaining the benefits, risks, and alternatives to procedure.  The patient was connected to monitor and placed in left lateral position. Continuous oxygen was provided by nasal cannula and IV medicine administered through an indwelling cannula.  After administration of sedation and rectal exam, the patients rectum was intubated and the EC-3890Li ([id number])  colonoscope was advanced under direct visualization to the ileum.  The scope was removed slowly by carefully examining the color, texture, anatomy, and integrity mucosa on the way out.  The patient was recovered in endoscopy and discharged home in satisfactory condition.    COLON FINDINGS: The examined terminal ileum appeared to be normal. , A sessile polyp measuring 3 mm in size was found in the descending colon.  A polypectomy was performed with cold forceps. , A sessile polyp measuring 6 mm in size was found in the descending colon.  A polypectomy was performed using snare cautery. , There was mild diverticulosis noted in the sigmoid colon with associated muscular hypertrophy.  , and Moderate sized external hemorrhoids were found.  PREP QUALITY: good.  CECAL W/D TIME: 14       minutes COMPLICATIONS: None  ENDOSCOPIC IMPRESSION: 1.   2 POLYPS REMOVED 2.   Mild diverticulosis in the sigmoid colon 3.   Moderate sized external  hemorrhoids  RECOMMENDATIONS: START OMEPRAZOLE.  TAKE 30 MINUTES PRIOR TO BREAKFAST AND SUPPER FOR 3 MOS THEN ONCE DAILY FOR 3 MOS. AVOID ITEMS THAT TRIGGER GASTRITIS. FOLLOW A HIGH FIBER/LOW FAT DIET.  AVOID ITEMS THAT CAUSE BLOATING.  AWAIT BIOPSY. REPEAT EGD IN 3 MOS. Next colonoscopy in 3-5 years.    _______________________________ eSignedDanie Binder, MD 2015/03/05 10:16 AM   CPT CODES: ICD CODES:  The ICD and CPT codes recommended by this software are interpretations from the data that the clinical staff has captured with the software.  The verification of the translation of this report to the ICD and CPT codes and modifiers is the sole responsibility of the health care institution and practicing physician where this report was generated.  [employer]. will not be held responsible for the validity of the ICD and CPT codes included on this report.  AMA assumes no liability for data contained or not contained herein. CPT is a Designer, television/film set of the [employer].

## 2015-03-04 ENCOUNTER — Encounter (HOSPITAL_COMMUNITY): Payer: Self-pay | Admitting: Gastroenterology

## 2015-03-16 ENCOUNTER — Telehealth: Payer: Self-pay

## 2015-03-16 NOTE — Telephone Encounter (Signed)
Please call pt. HE had simple adenomas removed. His stomach Bx shows mild gastritis.    TAKE OMEPRAZOLE 30 MINUTES PRIOR TO BREAKFAST AND SUPPER FOR 3 MOS THEN ONCE DAILY FOR 3 MOS.   AVOID ITEMS THAT TRIGGER GASTRITIS.   FOLLOW A HIGH FIBER/LOW FAT DIET. AVOID ITEMS THAT CAUSE BLOATING.   REPEAT EGD IN 3 MOS TO MAKE SURE ULCERS HAVE HEALED.  Next colonoscopy in 5 years.

## 2015-03-16 NOTE — Telephone Encounter (Signed)
Reminders in epic °

## 2015-03-16 NOTE — Telephone Encounter (Signed)
LMOM to call back

## 2015-03-16 NOTE — Telephone Encounter (Signed)
Pt called wanting his pathology results.

## 2015-03-17 NOTE — Telephone Encounter (Signed)
Pt is aware of results. 

## 2015-04-15 ENCOUNTER — Encounter: Payer: Self-pay | Admitting: Gastroenterology

## 2015-06-03 ENCOUNTER — Telehealth: Payer: Self-pay | Admitting: Gastroenterology

## 2015-06-03 ENCOUNTER — Ambulatory Visit: Payer: BLUE CROSS/BLUE SHIELD | Admitting: Gastroenterology

## 2015-06-03 NOTE — Telephone Encounter (Signed)
Called patient TO DISCUSS CONCERNS. LVM-CALL [phone] TO DISCUSS. OPV IN JUL 2016.

## 2015-06-04 NOTE — Telephone Encounter (Signed)
Pt has a appt with SF next available 07/22/15 at 9:30 appt letter mailed.

## 2015-07-22 ENCOUNTER — Encounter: Payer: Self-pay | Admitting: Gastroenterology

## 2015-07-22 ENCOUNTER — Ambulatory Visit (INDEPENDENT_AMBULATORY_CARE_PROVIDER_SITE_OTHER): Payer: BLUE CROSS/BLUE SHIELD | Admitting: Gastroenterology

## 2015-07-22 ENCOUNTER — Other Ambulatory Visit: Payer: Self-pay

## 2015-07-22 VITALS — BP 148/95 | HR 74 | Temp 97.0°F | Ht 73.0 in | Wt 210.0 lb

## 2015-07-22 DIAGNOSIS — K279 Peptic ulcer, site unspecified, unspecified as acute or chronic, without hemorrhage or perforation: Secondary | ICD-10-CM | POA: Diagnosis not present

## 2015-07-22 NOTE — Patient Instructions (Signed)
TAKE OMEPRAZOLE ONCE DAILY WITH BREAKFAST FOREVER TO PROTECT YOUR STOMACH FROM INJURY FROM ASPIRIN PRODUCTS.   REPEAT UPPER ENDOSCOPYTO ASSESS HEALING FOR ULCERS ON FRI SEP 2.

## 2015-07-22 NOTE — Progress Notes (Signed)
CC'ED TO PCP 

## 2015-07-22 NOTE — Progress Notes (Signed)
   Subjective:    Patient ID: Logan Hodge, male    DOB: Jun 09, 1966, 49 y.o.   MRN: [id number]  Florina Ou, MD  HPI LAST SEEN FOR EGD [facility] 2016: ULCERS/GASTRITIS. STOPPED TAKING EXCEDRIN. CHANGED TO ALTERNATIVE MED(ACET/CAFFEINE/ASA) ~2 WEEKS AGO. TAKES IT ONCE A MONTH. BMs: 2-3/Lyter,USU, #3-4. RARE HEARTBURN IF HE EATS TOO MUCH.  PT DENIES FEVER, CHILLS, HEMATOCHEZIA, nausea, vomiting, melena, diarrhea, CHEST PAIN, SHORTNESS OF BREATH,  CHANGE IN BOWEL IN HABITS, abdominal pain, problems swallowing, OR problems with sedation.   Past Medical History  Diagnosis Date  . Allergic rhinitis   . HLD (hyperlipidemia)   . HTN (hypertension)   . Melanoma     hx  . ED (erectile dysfunction)    Past Surgical History  Procedure Laterality Date  . Lasik  2007  . Hernia repair  1997  . Lipoma excision  2005    L arm  . Melanoma excision  2004    right shoulder   . Colonoscopy N/A 02/25/2015    Procedure: COLONOSCOPY;  Surgeon: Danie Binder, MD;  Location: [facility];  Service: Endoscopy;  Laterality: N/A;  915am - moved to 3/24 same time - Candy notified pt  . Esophagogastroduodenoscopy N/A 02/25/2015    Procedure: ESOPHAGOGASTRODUODENOSCOPY (EGD);  Surgeon: Danie Binder, MD;  Location: [facility];  Service: Endoscopy;  Laterality: N/A;   Allergies  Allergen Reactions  . Avelox [Moxifloxacin Hcl In Nacl] Itching   Current Outpatient Prescriptions  Medication Sig Dispense Refill  . atorvastatin (LIPITOR) 10 MG tablet Take 10 mg by mouth daily.      . brimonidine (ALPHAGAN P) 0.1 % SOLN Place 1 drop into both eyes daily as needed.    . desloratadine (CLARINEX) 5 MG tablet Take 5 mg by mouth daily as needed.      Marland Kitchen losartan-hydrochlorothiazide (HYZAAR) 50-12.5 MG per tablet Take 1 tablet by mouth daily.      Marland Kitchen omeprazole (PRILOSEC) 20 MG capsule 1 po bid for 3 mos then once daily    . amLODipine (NORVASC) 2.5 MG tablet Take 2.5 mg by mouth daily.       Review of Systems PER HPI  OTHERWISE ALL SYSTEMS ARE NEGATIVE.    Objective:   Physical Exam  Constitutional: He is oriented to person, place, and time. He appears well-developed and well-nourished. No distress.  HENT:  Head: Normocephalic and atraumatic.  Mouth/Throat: Oropharynx is clear and moist. No oropharyngeal exudate.  Eyes: Pupils are equal, round, and reactive to light. No scleral icterus.  Neck: Normal range of motion.  Cardiovascular: Normal rate, regular rhythm and normal heart sounds.   Pulmonary/Chest: Effort normal and breath sounds normal. No respiratory distress.  Abdominal: Soft. Bowel sounds are normal. He exhibits no distension. There is no tenderness.  Musculoskeletal: He exhibits no edema.  Neurological: He is alert and oriented to person, place, and time.  Psychiatric: He has a normal mood and affect.  Vitals reviewed.         Assessment & Plan:

## 2015-07-22 NOTE — Assessment & Plan Note (Addendum)
CLINICALLY IMPROVED.   TAKE OMEPRAZOLE ONCE DAILY WITH BREAKFAST FOREVER TO PROTECT YOUR STOMACH FROM INJURY FROM ASPIRIN PRODUCTS.  REPEAT EGD TO ASSESS HEALING FOR ULCERS. DISCUSSED PROCEDURE, BENEFITS, & RISKS: < 1% chance of medication reaction, OR bleeding. OUTPATIENT VISIT TO BE SCHEDULED AFTER EGD  GREATER THAN 50% WAS SPENT IN COUNSELING & COORDINATION OF CARE WITH THE PATIENT: DISCUSSED EGD MAR 2016 AND REVIEWED REPORT/IMAGES, PROCEDURE, BENEFITS, RISKS, AND MANAGEMENT OF ULCERS/GASTRITIS. TOTAL ENCOUNTER TIME: 15 MINS.

## 2015-07-27 ENCOUNTER — Other Ambulatory Visit: Payer: Self-pay

## 2015-08-20 ENCOUNTER — Ambulatory Visit (HOSPITAL_COMMUNITY)
Admission: RE | Admit: 2015-08-20 | Discharge: 2015-08-20 | Disposition: A | Discharge status: Home / Self Care | Payer: BLUE CROSS/BLUE SHIELD | Source: Ambulatory Visit | Attending: Gastroenterology | Admitting: Gastroenterology

## 2015-08-20 ENCOUNTER — Encounter (HOSPITAL_COMMUNITY): Payer: Self-pay

## 2015-08-20 ENCOUNTER — Encounter (HOSPITAL_COMMUNITY)
Admission: RE | Disposition: A | Discharge status: Home / Self Care | Payer: Self-pay | Source: Ambulatory Visit | Attending: Gastroenterology

## 2015-08-20 DIAGNOSIS — K2980 Duodenitis without bleeding: Secondary | ICD-10-CM | POA: Diagnosis not present

## 2015-08-20 DIAGNOSIS — E785 Hyperlipidemia, unspecified: Secondary | ICD-10-CM | POA: Diagnosis not present

## 2015-08-20 DIAGNOSIS — K279 Peptic ulcer, site unspecified, unspecified as acute or chronic, without hemorrhage or perforation: Secondary | ICD-10-CM | POA: Diagnosis not present

## 2015-08-20 DIAGNOSIS — I10 Essential (primary) hypertension: Secondary | ICD-10-CM | POA: Insufficient documentation

## 2015-08-20 DIAGNOSIS — K259 Gastric ulcer, unspecified as acute or chronic, without hemorrhage or perforation: Secondary | ICD-10-CM | POA: Diagnosis not present

## 2015-08-20 DIAGNOSIS — Z85820 Personal history of malignant melanoma of skin: Secondary | ICD-10-CM | POA: Diagnosis not present

## 2015-08-20 DIAGNOSIS — Z79899 Other long term (current) drug therapy: Secondary | ICD-10-CM | POA: Insufficient documentation

## 2015-08-20 HISTORY — PX: ESOPHAGOGASTRODUODENOSCOPY: SHX5428

## 2015-08-20 SURGERY — EGD (ESOPHAGOGASTRODUODENOSCOPY)
Anesthesia: Moderate Sedation

## 2015-08-20 MED ORDER — MIDAZOLAM HCL 5 MG/5ML IJ SOLN
INTRAMUSCULAR | Status: DC | PRN
  Administered 2015-08-20 (×3): 2 mg via INTRAVENOUS

## 2015-08-20 MED ORDER — SODIUM CHLORIDE 0.9 % IV SOLN
INTRAVENOUS | Status: DC
  Administered 2015-08-20: 09:00:00 via INTRAVENOUS

## 2015-08-20 MED ORDER — LIDOCAINE VISCOUS 2 % MT SOLN
OROMUCOSAL | Status: AC
  Filled 2015-08-20: qty 15

## 2015-08-20 MED ORDER — MEPERIDINE HCL 100 MG/ML IJ SOLN
INTRAMUSCULAR | Status: AC
  Filled 2015-08-20: qty 2

## 2015-08-20 MED ORDER — MEPERIDINE HCL 100 MG/ML IJ SOLN
INTRAMUSCULAR | Status: DC | PRN
  Administered 2015-08-20: 25 mg via INTRAVENOUS
  Administered 2015-08-20: 50 mg via INTRAVENOUS

## 2015-08-20 MED ORDER — LIDOCAINE VISCOUS 2 % MT SOLN
OROMUCOSAL | Status: DC | PRN
  Administered 2015-08-20: 4 mL via OROMUCOSAL

## 2015-08-20 MED ORDER — STERILE WATER FOR IRRIGATION IR SOLN
Status: DC | PRN
  Administered 2015-08-20: 09:00:00

## 2015-08-20 MED ORDER — MIDAZOLAM HCL 5 MG/5ML IJ SOLN
INTRAMUSCULAR | Status: AC
  Filled 2015-08-20: qty 10

## 2015-08-20 NOTE — Op Note (Signed)
[facility] [address] [place], [address]   ENDOSCOPY PROCEDURE REPORT  PATIENT: Logan Hodge, Logan Hodge  MR#: [id number] BIRTHDATE: Mar 06, 1966 , 49  yrs. old GENDER: male  ENDOSCOPIST: Danie Binder, MD REFERRED WS:FKCLE Spear, M.D.  PROCEDURE DATE: 2015-09-04 PROCEDURE:   EGD, screening  INDICATIONS:ULCERS ON EGD MAR 2016. MEDICATIONS: Demerol 100 mg IV and Versed 6 mg IV TOPICAL ANESTHETIC:   Viscous Xylocaine ASA CLASS:  DESCRIPTION OF PROCEDURE:     Physical exam was performed.  Informed consent was obtained from the patient after explaining the benefits, risks, and alternatives to the procedure.  The patient was connected to the monitor and placed in the left lateral position.  Continuous oxygen was provided by nasal cannula and IV medicine administered through an indwelling cannula.  After administration of sedation, the patients esophagus was intubated and the EG-[id number] ([id number])  endoscope was advanced under direct visualization to the second portion of the duodenum.  The scope was removed slowly by carefully examining the color, texture, anatomy, and integrity of the mucosa on the way out.  The patient was recovered in endoscopy and discharged home in satisfactory condition.  Estimated blood loss is zero unless otherwise noted in this procedure report.     ESOPHAGUS: The mucosa of the esophagus appeared normal.   STOMACH: Mild non-erosive gastritis (inflammation) was found in the gastric antrum.   DUODENUM: Mild duodenal inflammation was found in the duodenal bulb.   The duodenal mucosa showed no abnormalities in the 2nd part of the duodenum.  COMPLICATIONS: There were no immediate complications.  ENDOSCOPIC IMPRESSION: 1.   MILD Non-erosive gastritis AND DUODENITIS  RECOMMENDATIONS: FOLLOW A LOW FAT DIET. TAKE OMEPRAZOLE ONCE DAILY WITH BREAKFAST FOREVER TO PREVENT ULCERS.  FOLLOW UP IN SEP 2017.  REPEAT  EXAM: _______________________________ eSignedDanie Binder, MD 09/04/15 10:09 AM    CPT CODES: ICD CODES:  The ICD and CPT codes recommended by this software are interpretations from the data that the clinical staff has captured with the software.  The verification of the translation of this report to the ICD and CPT codes and modifiers is the sole responsibility of the health care institution and practicing physician where this report was generated.  [facility] will not be held responsible for the validity of the ICD and CPT codes included on this report.  AMA assumes no liability for data contained or not contained herein. CPT is a Designer, television/film set of the [employer].

## 2015-08-20 NOTE — Discharge Instructions (Signed)
You have MILD gastritis & DUODENITIS. YOUR  ULCERS ARE HEALED.   FOLLOW A LOW FAT DIET. SEE INFO BELOW.  TAKE OMEPRAZOLE ONCE DAILY WITH BREAKFAST FOREVER TO PROTECT YOUR STOMACH FROM INJURY FROM ASPIRIN.   FOLLOW UP IN SEP 2017.   UPPER ENDOSCOPY AFTER CARE Read the instructions outlined below and refer to this sheet in the next week. These discharge instructions provide you with general information on caring for yourself after you leave the hospital. While your treatment has been planned according to the most current medical practices available, unavoidable complications occasionally occur. If you have any problems or questions after discharge, call DR. Ayomikun Starling, [phone].  ACTIVITY  You may resume your regular activity, but move at a slower pace for the next 24 hours.   Take frequent rest periods for the next 24 hours.   Walking will help get rid of the air and reduce the bloated feeling in your belly (abdomen).   No driving for 24 hours (because of the medicine (anesthesia) used during the test).   You may shower.   Do not sign any important legal documents or operate any machinery for 24 hours (because of the anesthesia used during the test).    NUTRITION  Drink plenty of fluids.   You may resume your normal diet as instructed by your doctor.   Begin with a light meal and progress to your normal diet. Heavy or fried foods are harder to digest and may make you feel sick to your stomach (nauseated).   Avoid alcoholic beverages for 24 hours or as instructed.    MEDICATIONS  You may resume your normal medications.   WHAT YOU CAN EXPECT TODAY  Some feelings of bloating in the abdomen.   Passage of more gas than usual.    IF YOU HAD A BIOPSY TAKEN DURING THE UPPER ENDOSCOPY:  Eat a soft diet IF YOU HAVE NAUSEA, BLOATING, ABDOMINAL PAIN, OR VOMITING.    FINDING OUT THE RESULTS OF YOUR TEST Not all test results are available during your visit. DR. Oneida Alar WILL  CALL YOU WITHIN 14 DAYS OF YOUR PROCEDUE WITH YOUR RESULTS. Do not assume everything is normal if you have not heard from DR. Lilyona Richner IN ONE WEEK, CALL HER OFFICE AT [phone].  SEEK IMMEDIATE MEDICAL ATTENTION AND CALL THE OFFICE: [phone] IF:  You have more than a spotting of blood in your stool.   Your belly is swollen (abdominal distention).   You are nauseated or vomiting.   You have a temperature over 101F.   You have abdominal pain or discomfort that is severe or gets worse throughout the [place].   Gastritis/DUODENITIS  Gastritis is an inflammation (the body's way of reacting to injury and/or infection) of the stomach. DUODENITIS is an inflammation (the body's way of reacting to injury and/or infection) of the FIRST PART OF THE SMALL INTESTINES. It is often caused by bacterial (germ) infections. It can also be caused BY ASPIRIN, BC/GOODY POWDER'S, (IBUPROFEN) MOTRIN, OR ALEVE (NAPROXEN), chemicals (including alcohol), SPICY FOODS, and medications. This illness may be associated with generalized malaise (feeling tired, not well), UPPER ABDOMINAL STOMACH cramps, and fever. One common bacterial cause of gastritis is an organism known as H. Pylori. This can be treated with antibiotics.     Low-Fat Diet BREADS, CEREALS, PASTA, RICE, DRIED PEAS, AND BEANS These products are high in carbohydrates and most are low in fat. Therefore, they can be increased in the diet as substitutes for fatty foods. They too, however,  contain calories and should not be eaten in excess. Cereals can be eaten for snacks as well as for breakfast.  Include foods that contain fiber (fruits, vegetables, whole grains, and legumes). Research shows that fiber may lower blood cholesterol levels, especially the water-soluble fiber found in fruits, vegetables, oat products, and legumes. FRUITS AND VEGETABLES It is good to eat fruits and vegetables. Besides being sources of fiber, both are rich in vitamins and some  minerals. They help you get the daily allowances of these nutrients. Fruits and vegetables can be used for snacks and desserts. MEATS Limit lean meat, chicken, [place], and fish to no more than 6 ounces per [place]. Beef, Pork, and Lamb Use lean cuts of beef, pork, and lamb. Lean cuts include:  Extra-lean ground beef.  Arm roast.  Sirloin tip.  Center-cut ham.  Round steak.  Loin chops.  Rump roast.  Tenderloin.  Trim all fat off the outside of meats before cooking. It is not necessary to severely decrease the intake of red meat, but lean choices should be made. Lean meat is rich in protein and contains a highly absorbable form of iron. Premenopausal women, in particular, should avoid reducing lean red meat because this could increase the risk for low red blood cells (iron-deficiency anemia).  Chicken and [place] These are good sources of protein. The fat of poultry can be reduced by removing the skin and underlying fat layers before cooking. Chicken and [place] can be substituted for lean red meat in the diet. Poultry should not be fried or covered with high-fat sauces. Fish and Shellfish Fish is a good source of protein. Shellfish contain cholesterol, but they usually are low in saturated fatty acids. The preparation of fish is important. Like chicken and [place], they should not be fried or covered with high-fat sauces. EGGS Egg whites contain no fat or cholesterol. They can be eaten often. Try 1 to 2 egg whites instead of whole eggs in recipes or use egg substitutes that do not contain yolk.  MILK AND DAIRY PRODUCTS Use skim or 1% milk instead of 2% or whole milk. Decrease whole milk, natural, and processed cheeses. Use nonfat or low-fat (2%) cottage cheese or low-fat cheeses made from vegetable oils. Choose nonfat or low-fat (1 to 2%) yogurt. Experiment with evaporated skim milk in recipes that call for heavy cream. Substitute low-fat yogurt or low-fat cottage cheese for sour cream in dips and  salad dressings. Have at least 2 servings of low-fat dairy products, such as 2 glasses of skim (or 1%) milk each [place] to help get your daily calcium intake.  FATS AND OILS Butterfat, lard, and beef fats are high in saturated fat and cholesterol. These should be avoided.Vegetable fats do not contain cholesterol. AVOID coconut oil, palm oil, and palm kernel oil, WHICH are very high in saturated fats. These should be limited. These fats are often used in bakery goods, processed foods, popcorn, oils, and nondairy creamers. Vegetable shortenings and some peanut butters contain hydrogenated oils, which are also saturated fats. Read the labels on these foods and check for saturated vegetable oils.  Desirable liquid vegetable oils are corn oil, cottonseed oil, olive oil, canola oil, safflower oil, soybean oil, and sunflower oil. Peanut oil is not as good, but small amounts are acceptable. Buy a heart-healthy tub margarine that has no partially hydrogenated oils in the ingredients. AVOID Mayonnaise and salad dressings often are made from unsaturated fats.  OTHER EATING TIPS Snacks  Most sweets should be limited as  snacks. They tend to be rich in calories and fats, and their caloric content outweighs their nutritional value. Some good choices in snacks are graham crackers, melba toast, soda crackers, bagels (no egg), English muffins, fruits, and vegetables. These snacks are preferable to snack crackers, [place] fries, and chips. Popcorn should be air-popped or cooked in small amounts of liquid vegetable oil.  Desserts Eat fruit, low-fat yogurt, and fruit ices instead of pastries, cake, and cookies. Sherbet, angel food cake, gelatin dessert, frozen low-fat yogurt, or other frozen products that do not contain saturated fat (pure fruit juice bars, frozen ice pops) are also acceptable.   COOKING METHODS Choose those methods that use little or no fat. They include: Poaching.  Braising.  Steaming.  Grilling.    Baking.  Stir-frying.  Broiling.  Microwaving.  Foods can be cooked in a nonstick pan without added fat, or use a nonfat cooking spray in regular cookware. Limit fried foods and avoid frying in saturated fat. Add moisture to lean meats by using water, broth, cooking wines, and other nonfat or low-fat sauces along with the cooking methods mentioned above. Soups and stews should be chilled after cooking. The fat that forms on top after a few hours in the refrigerator should be skimmed off. When preparing meals, avoid using excess salt. Salt can contribute to raising blood pressure in some people.  EATING AWAY FROM HOME Order entres, potatoes, and vegetables without sauces or butter. When meat exceeds the size of a deck of cards (3 to 4 ounces), the rest can be taken home for another meal. Choose vegetable or fruit salads and ask for low-calorie salad dressings to be served on the side. Use dressings sparingly. Limit high-fat toppings, such as bacon, crumbled eggs, cheese, sunflower seeds, and olives. Ask for heart-healthy tub margarine instead of butter.

## 2015-08-20 NOTE — H&P (View-Only) (Signed)
   Subjective:    Patient ID: Logan Hodge, male    DOB: 03/21/1966, 49 y.o.   MRN: [id number]  SPEAR, TAMMY, MD  HPI LAST SEEN FOR EGD MAR 2016: ULCERS/GASTRITIS. STOPPED TAKING EXCEDRIN. CHANGED TO ALTERNATIVE MED(ACET/CAFFEINE/ASA) ~2 WEEKS AGO. TAKES IT ONCE A MONTH. BMs: 2-3/Fults,USU, #3-4. RARE HEARTBURN IF HE EATS TOO MUCH.  PT DENIES FEVER, CHILLS, HEMATOCHEZIA, nausea, vomiting, melena, diarrhea, CHEST PAIN, SHORTNESS OF BREATH,  CHANGE IN BOWEL IN HABITS, abdominal pain, problems swallowing, OR problems with sedation.   Past Medical History  Diagnosis Date  . Allergic rhinitis   . HLD (hyperlipidemia)   . HTN (hypertension)   . Melanoma     hx  . ED (erectile dysfunction)    Past Surgical History  Procedure Laterality Date  . Lasik  2007  . Hernia repair  1997  . Lipoma excision  2005    L arm  . Melanoma excision  2004    right shoulder   . Colonoscopy N/A 02/25/2015    Procedure: COLONOSCOPY;  Surgeon: Dante Cooter L Conchetta Lamia, MD;  [facility];  Service: Endoscopy;  Laterality: N/A;  915am - moved to 3/24 same time - Candy notified pt  . Esophagogastroduodenoscopy N/A 02/25/2015    Procedure: ESOPHAGOGASTRODUODENOSCOPY (EGD);  Surgeon: Maleiah Dula L Lamarkus Nebel, MD;  Location: [facility];  Service: Endoscopy;  Laterality: N/A;   Allergies  Allergen Reactions  . Avelox [Moxifloxacin Hcl In Nacl] Itching   Current Outpatient Prescriptions  Medication Sig Dispense Refill  . atorvastatin (LIPITOR) 10 MG tablet Take 10 mg by mouth daily.      . brimonidine (ALPHAGAN P) 0.1 % SOLN Place 1 drop into both eyes daily as needed.    . desloratadine (CLARINEX) 5 MG tablet Take 5 mg by mouth daily as needed.      . losartan-hydrochlorothiazide (HYZAAR) 50-12.5 MG per tablet Take 1 tablet by mouth daily.      . omeprazole (PRILOSEC) 20 MG capsule 1 po bid for 3 mos then once daily    . amLODipine (NORVASC) 2.5 MG tablet Take 2.5 mg by mouth daily.       Review of Systems PER HPI  OTHERWISE ALL SYSTEMS ARE NEGATIVE.    Objective:   Physical Exam  Constitutional: He is oriented to person, place, and time. He appears well-developed and well-nourished. No distress.  HENT:  Head: Normocephalic and atraumatic.  Mouth/Throat: Oropharynx is clear and moist. No oropharyngeal exudate.  Eyes: Pupils are equal, round, and reactive to light. No scleral icterus.  Neck: Normal range of motion.  Cardiovascular: Normal rate, regular rhythm and normal heart sounds.   Pulmonary/Chest: Effort normal and breath sounds normal. No respiratory distress.  Abdominal: Soft. Bowel sounds are normal. He exhibits no distension. There is no tenderness.  Musculoskeletal: He exhibits no edema.  Neurological: He is alert and oriented to person, place, and time.  Psychiatric: He has a normal mood and affect.  Vitals reviewed.         Assessment & Plan:   

## 2015-08-20 NOTE — Interval H&P Note (Signed)
History and Physical Interval Note:  08/20/2015 9:22 AM  Logan Hodge  has presented today for surgery, with the diagnosis of ULCERS  The various methods of treatment have been discussed with the patient and family. After consideration of risks, benefits and other options for treatment, the patient has consented to  Procedure(s) with comments: ESOPHAGOGASTRODUODENOSCOPY (EGD) (N/A) - 315 - moved to 9/16 @ 9:30 as a surgical intervention .  The patient's history has been reviewed, patient examined, no change in status, stable for surgery.  I have reviewed the patient's chart and labs.  Questions were answered to the patient's satisfaction.     [employer]

## 2015-08-23 ENCOUNTER — Encounter (HOSPITAL_COMMUNITY): Payer: Self-pay | Admitting: Gastroenterology

## 2015-10-15 DIAGNOSIS — K5730 Diverticulosis of large intestine without perforation or abscess without bleeding: Secondary | ICD-10-CM | POA: Insufficient documentation

## 2016-08-23 ENCOUNTER — Encounter: Payer: Self-pay | Admitting: Gastroenterology

## 2016-08-23 ENCOUNTER — Ambulatory Visit (INDEPENDENT_AMBULATORY_CARE_PROVIDER_SITE_OTHER): Payer: BLUE CROSS/BLUE SHIELD | Admitting: Gastroenterology

## 2016-08-23 DIAGNOSIS — K279 Peptic ulcer, site unspecified, unspecified as acute or chronic, without hemorrhage or perforation: Secondary | ICD-10-CM

## 2016-08-23 DIAGNOSIS — K219 Gastro-esophageal reflux disease without esophagitis: Secondary | ICD-10-CM

## 2016-08-23 MED ORDER — DEXLANSOPRAZOLE 60 MG PO CPDR: DELAYED_RELEASE_CAPSULE | ORAL | 11 refills | Status: DC

## 2016-08-23 NOTE — Progress Notes (Signed)
ON RECALL  °

## 2016-08-23 NOTE — Patient Instructions (Addendum)
AVOID FOOD THAT TRIGGERS REFLUX. SEE INFO BELOW.  TRY DEXILANT DAILY FOR 2 WEEKS. SEND ME A MESSAGE IN MY CHART WITH AN UPDATE.  I SENT A PRESCRIPTION TO YOUR PHARMACY TO SEE IF THE INSURANCE WILL APPROVE IT.  FOLLOW UP IN 6 MOS.     Lifestyle and home remedies TO HELP CONTROL HEARTBURN.  You may eliminate or reduce the frequency of heartburn by making the following lifestyle changes:  . Control your weight. Being overweight is a major risk factor for heartburn and GERD. Excess pounds put pressure on your abdomen, pushing up your stomach and causing acid to back up into your esophagus.   . Eat smaller meals. 4 TO 6 MEALS A [place]. This reduces pressure on the lower esophageal sphincter, helping to prevent the valve from opening and acid from washing back into your esophagus.   Dolphus Jenny your belt. Clothes that fit tightly around your waist put pressure on your abdomen and the lower esophageal sphincter.  .  . Eliminate heartburn triggers. Everyone has specific triggers. Common triggers such as fatty or fried foods, spicy food, tomato sauce, carbonated beverages, alcohol, chocolate, mint, garlic, onion, caffeine and nicotine may make heartburn worse.   Marland Kitchen Avoid stooping or bending. Tying your shoes is OK. Bending over for longer periods to weed your garden isn't, especially soon after eating.   . Don't lie down after a meal. Wait at least three to four hours after eating before going to bed, and don't lie down right after eating.   Marland Kitchen PLACE THE HEAD OF YOUR BED ON 6 INCH BLOCKS OR SLEEP ON A WEDGE.  Alternative medicine . Several home remedies exist for treating GERD, but they provide only temporary relief. They include drinking baking soda (sodium bicarbonate) added to water or drinking other fluids such as baking soda mixed with cream of tartar and water. . Although these liquids create temporary relief by neutralizing, washing away or buffering acids, eventually they aggravate the situation  by adding gas and fluid to your stomach, increasing pressure and causing more acid reflux. Further, adding more sodium to your diet may increase your blood pressure and add stress to your heart, and excessive bicarbonate ingestion can alter the acid-base balance in your body.

## 2016-08-23 NOTE — Assessment & Plan Note (Signed)
SYMPTOMS NOT IDEALLY CONTROLLED OFF OMEPRAZOLE. STOPPED DUE TO RUNNY NOSE.   AVOID FOOD THAT TRIGGERS REFLUX.  HANDOUT GIVEN.  TRY DEXILANT DAILY FOR 2 WEEKS. SEND ME A MESSAGE IN MY CHART WITH AN UPDATE.  SENT A PRESCRIPTION TO PHARMACY TO SEE IF INSURANCE WILL APPROVE IT.  FOLLOW UP IN 6 MOS.

## 2016-08-23 NOTE — Progress Notes (Signed)
   Subjective:    Patient ID: Logan Hodge, male    DOB: [birth year], 50 y.o.   MRN: [id number] Florina Ou, MD (Inactive)  HPI TROUBLE WITH RUNNY NOSE. THINKS IT'S DUE TO OMEPRAZOLE. ALSO C/O SORE THROAT. OCCASIONAL HEARTBURN RELATED TO STRESS/SCHEDULE. WILL HAVE FLARES AND AT OTHER TIMES NO PROBLEMS. BOWELS ARE GOOD.  PT DENIES FEVER, CHILLS, HEMATOCHEZIA, HEMATEMESIS, nausea, vomiting, melena, diarrhea, CHEST PAIN, SHORTNESS OF BREATH,  CHANGE IN BOWEL IN HABITS, constipation, abdominal pain, OR problems swallowing.    Past Medical History:  Diagnosis Date  . Allergic rhinitis   . ED (erectile dysfunction)   . HLD (hyperlipidemia)   . HTN (hypertension)   . Melanoma ([place])    hx   , Past Surgical History:  Procedure Laterality Date  . COLONOSCOPY N/A 02/25/2015   Procedure: COLONOSCOPY;  Surgeon: Danie Binder, MD;  [facility];  Service: Endoscopy;  Laterality: N/A;  915am - moved to 3/24 same time - Candy notified pt  . ESOPHAGOGASTRODUODENOSCOPY N/A 02/25/2015   Procedure: ESOPHAGOGASTRODUODENOSCOPY (EGD);  Surgeon: Danie Binder, MD;  [facility];  Service: Endoscopy;  Laterality: N/A;  . ESOPHAGOGASTRODUODENOSCOPY N/A 08/20/2015   Procedure: ESOPHAGOGASTRODUODENOSCOPY (EGD);  Surgeon: Danie Binder, MD;  [facility];  Service: Endoscopy;  Laterality: N/A;  315 - moved to 9/16 @ 9:30  . HERNIA REPAIR  1997  . LASIK  2007  . LIPOMA EXCISION  2005   L arm  . MELANOMA EXCISION  2004   right shoulder     Allergies  Allergen Reactions  . Avelox [Moxifloxacin Hcl In Nacl] Itching    Current Outpatient Prescriptions  Medication Sig Dispense Refill  . amLODipine (NORVASC) 2.5 MG tablet Take 2.5 mg by mouth daily.    Marland Kitchen atorvastatin (LIPITOR) 10 MG tablet Take 10 mg by mouth daily.      . brimonidine (ALPHAGAN P) 0.1 % SOLN Place 1 drop into both eyes daily as needed.    . desloratadine (CLARINEX) 5 MG tablet Take 5 mg by mouth daily as  needed.      Marland Kitchen losartan-hydrochlorothiazide (HYZAAR) 50-12.5 MG per tablet Take 1 tablet by mouth daily.      .         Review of Systems PER HPI OTHERWISE ALL SYSTEMS ARE NEGATIVE.    Objective:   Physical Exam  Constitutional: He is oriented to person, place, and time. He appears well-developed and well-nourished. No distress.  HENT:  Head: Normocephalic and atraumatic.  Mouth/Throat: Oropharynx is clear and moist. No oropharyngeal exudate.  Eyes: Pupils are equal, round, and reactive to light. No scleral icterus.  Neck: Normal range of motion. Neck supple.  Cardiovascular: Normal rate, regular rhythm and normal heart sounds.   Pulmonary/Chest: Effort normal and breath sounds normal. No respiratory distress.  Abdominal: Soft. Bowel sounds are normal. He exhibits no distension. There is no tenderness.  Musculoskeletal: He exhibits no edema.  Lymphadenopathy:    He has no cervical adenopathy.  Neurological: He is alert and oriented to person, place, and time.  NO FOCAL DEFICITS  Psychiatric: He has a normal mood and affect.  Vitals reviewed.     Assessment & Plan:

## 2016-08-23 NOTE — Progress Notes (Signed)
CC'ED TO PCP 

## 2016-08-23 NOTE — Assessment & Plan Note (Signed)
SYMPTOMS CONTROLLED/RESOLVED.  CONTINUE TO MONITOR SYMPTOMS. 

## 2017-01-11 ENCOUNTER — Encounter: Payer: Self-pay | Admitting: Gastroenterology

## 2017-01-15 ENCOUNTER — Telehealth: Payer: Self-pay | Admitting: Gastroenterology

## 2017-01-15 NOTE — Telephone Encounter (Signed)
Pt has used his dexilant samples and needs a prescription called into [facility].

## 2017-01-15 NOTE — Telephone Encounter (Signed)
I saw Dr. Oneida Alar gave pt a prescription in 08/2016 with 11 refills. I called pharmacy and spoke to the pharmacist who said pt has not gotten any yet. He run it and it is expensive, said he can give him a savings card. It does not required PA. I called and left message on machine for the pt that it is being filled and to call me if any questions. I told him also, we do not have samples at this time.

## 2017-01-15 NOTE — Telephone Encounter (Signed)
REVIEWED-NO ADDITIONAL RECOMMENDATIONS. 

## 2017-01-31 ENCOUNTER — Encounter: Payer: Self-pay | Admitting: Gastroenterology

## 2017-03-07 ENCOUNTER — Ambulatory Visit: Payer: BLUE CROSS/BLUE SHIELD | Admitting: Gastroenterology

## 2017-03-15 ENCOUNTER — Encounter: Payer: Self-pay | Admitting: Gastroenterology

## 2017-03-15 ENCOUNTER — Ambulatory Visit (INDEPENDENT_AMBULATORY_CARE_PROVIDER_SITE_OTHER): Payer: BLUE CROSS/BLUE SHIELD | Admitting: Gastroenterology

## 2017-03-15 DIAGNOSIS — K219 Gastro-esophageal reflux disease without esophagitis: Secondary | ICD-10-CM

## 2017-03-15 DIAGNOSIS — R1310 Dysphagia, unspecified: Secondary | ICD-10-CM | POA: Insufficient documentation

## 2017-03-15 MED ORDER — PANTOPRAZOLE SODIUM 40 MG PO TBEC: DELAYED_RELEASE_TABLET | ORAL | 11 refills | Status: DC

## 2017-03-15 NOTE — Progress Notes (Signed)
ON RECALL  °

## 2017-03-15 NOTE — Assessment & Plan Note (Signed)
CAN'T AFFORD DEXILANT. SYMPTOMS FAIRLY WELL CONTROLLED.  AVOID TRIGGERS FOR REFLUX.  HANDOUT GIVEN. FOLLOW A LOW FAT DIET. MEATS SHOULD BE BAKED, BROILED, OR BOILED. AVODIFRIED FOODS.  STOP DEXILANT. START PROTONIX. TAKE 30 MINUTES PRIOR TO BREAKFAST.  FOLLOW UP IN 6 MOS.

## 2017-03-15 NOTE — Patient Instructions (Signed)
AVOID TRIGGERS FOR REFLUX. SEE INFO BELOW.  FOLLOW A LOW FAT DIET. MEATS SHOULD BE BAKED, BROILED, OR BOILED. AVODIFRIED FOODS.   STOP DEXILANT. START PROTONIX. TAKE 30 MINUTES PRIOR TO BREAKFAST.  FOLLOW UP IN 6 MOS.     Lifestyle and home remedies TO CONTROL HEARTBURN/REFLUX You may eliminate or reduce the frequency of heartburn by making the following lifestyle changes:  . Control your weight. Being overweight is a major risk factor for heartburn and GERD. Excess pounds put pressure on your abdomen, pushing up your stomach and causing acid to back up into your esophagus.   . Eat smaller meals. 4 TO 6 MEALS A [place]. This reduces pressure on the lower esophageal sphincter, helping to prevent the valve from opening and acid from washing back into your esophagus.   Dolphus Jenny your belt. Clothes that fit tightly around your waist put pressure on your abdomen and the lower esophageal sphincter.    . Eliminate heartburn triggers. Everyone has specific triggers. Common triggers such as fatty or fried foods, spicy food, tomato sauce, carbonated beverages, alcohol, chocolate, mint, garlic, onion, caffeine and nicotine may make heartburn worse.   Marland Kitchen Avoid stooping or bending. Tying your shoes is OK. Bending over for longer periods to weed your garden isn't, especially soon after eating.   . Don't lie down after a meal. Wait at least three to four hours after eating before going to bed, and don't lie down right after eating.   Alternative medicine . Several home remedies exist for treating GERD, but they provide only temporary relief. They include drinking baking soda (sodium bicarbonate) added to water or drinking other fluids such as baking soda mixed with cream of tartar and water. . Although these liquids create temporary relief by neutralizing, washing away or buffering acids, eventually they aggravate the situation by adding gas and fluid to your stomach, increasing pressure and causing more acid  reflux. Further, adding more sodium to your diet may increase your blood pressure and add stress to your heart, and excessive bicarbonate ingestion can alter the acid-base balance in your body.

## 2017-03-15 NOTE — Progress Notes (Signed)
   Subjective:    Patient ID: Logan Hodge, male    DOB: 13-Jul-1966, 51 y.o.   MRN: [id number]  Logan Aly, FNP  HPI No questions or concerns. Mustard or citrus makes his throat feel like it swells. Benadryl makes it better. if drinks milk, makes it itch. DEXILANT WORKS BUT $200 FOR A MONTH PILLS. WORK HAS CHANGED BUT LEARNING TO LET GO.  OMEPRAZOLE MADE SINUSES DRAINING. HEARTBURN FAIRLY WELL CONTROLLED. SYMPTOMS ONCE A MO. BOWELS GOOD. OCCASIONAL SOFT/LOOSE STOOLS BUT EVERY RARE. BMs: 1-2X/[place].   PT DENIES FEVER, CHILLS, HEMATOCHEZIA, HEMATEMESIS, nausea, vomiting, melena, diarrhea, CHEST PAIN, SHORTNESS OF BREATH,  CHANGE IN BOWEL IN HABITS, constipation, abdominal pain, OR problems swallowing.  Past Medical History:  Diagnosis Date  . Allergic rhinitis   . ED (erectile dysfunction)   . HLD (hyperlipidemia)   . HTN (hypertension)   . Melanoma ([place])    hx   Past Surgical History:  Procedure Laterality Date  . COLONOSCOPY N/A 02/25/2015     . ESOPHAGOGASTRODUODENOSCOPY N/A 02/25/2015     . ESOPHAGOGASTRODUODENOSCOPY N/A 08/20/2015     . HERNIA REPAIR  1997  . LASIK  2007  . LIPOMA EXCISION  2005   L arm  . MELANOMA EXCISION  2004   right shoulder     Allergies  Allergen Reactions  . Avelox [Moxifloxacin Hcl In Nacl] Itching    Current Outpatient Prescriptions  Medication Sig Dispense Refill  . amLODipine (NORVASC) 2.5 MG tablet Take 2.5 mg by mouth as needed.     Marland Kitchen atorvastatin (LIPITOR) 10 MG tablet Take 10 mg by mouth daily.      . brimonidine (ALPHAGAN P) 0.1 % SOLN Place 1 drop into both eyes daily as needed.    . desloratadine (CLARINEX) 5 MG tablet Take 5 mg by mouth daily as needed.      Marland Kitchen losartan-hydrochlorothiazide (HYZAAR) 50-12.5 MG per tablet Take 1 tablet by mouth daily.      .      .       Review of Systems PER HPI OTHERWISE ALL SYSTEMS ARE NEGATIVE.    Objective:   Physical Exam  Constitutional: He is oriented to person, place, and time. He  appears well-developed and well-nourished. No distress.  HENT:  Head: Normocephalic and atraumatic.  Mouth/Throat: Oropharynx is clear and moist. No oropharyngeal exudate.  Eyes: Pupils are equal, round, and reactive to light. No scleral icterus.  Neck: Normal range of motion. Neck supple.  Cardiovascular: Normal rate, regular rhythm and normal heart sounds.   Pulmonary/Chest: Effort normal and breath sounds normal. No respiratory distress.  Abdominal: Soft. Bowel sounds are normal. He exhibits no distension. There is no tenderness.  Musculoskeletal: He exhibits no edema.  Lymphadenopathy:    He has no cervical adenopathy.  Neurological: He is alert and oriented to person, place, and time.  NO FOCAL DEFICITS  Psychiatric:  SLIGHTLY ANXIOUS MOOD, NL AFFECT  Vitals reviewed.     Assessment & Plan:

## 2017-03-15 NOTE — Assessment & Plan Note (Addendum)
OCCURS WITH CERTAIN LIQUIDS & MAY BE RELATED TO FOOD ALLERGIES.  CONTINUE TO MONITOR SYMPTOMS. NO INDICATION FOR ENDOSCOPY AT THIS TIME.

## 2017-03-16 NOTE — Progress Notes (Signed)
cc'ed to the pcp

## 2017-08-14 ENCOUNTER — Encounter: Payer: Self-pay | Admitting: Gastroenterology

## 2018-12-10 DIAGNOSIS — I10 Essential (primary) hypertension: Secondary | ICD-10-CM | POA: Diagnosis not present

## 2018-12-10 DIAGNOSIS — J0100 Acute maxillary sinusitis, unspecified: Secondary | ICD-10-CM | POA: Diagnosis not present

## 2018-12-10 DIAGNOSIS — E782 Mixed hyperlipidemia: Secondary | ICD-10-CM | POA: Diagnosis not present

## 2018-12-18 DIAGNOSIS — I10 Essential (primary) hypertension: Secondary | ICD-10-CM | POA: Diagnosis not present

## 2018-12-18 DIAGNOSIS — E782 Mixed hyperlipidemia: Secondary | ICD-10-CM | POA: Diagnosis not present

## 2019-07-31 DIAGNOSIS — Z125 Encounter for screening for malignant neoplasm of prostate: Secondary | ICD-10-CM | POA: Diagnosis not present

## 2019-07-31 DIAGNOSIS — Z0000 Encounter for general adult medical examination without abnormal findings: Secondary | ICD-10-CM | POA: Diagnosis not present

## 2019-07-31 DIAGNOSIS — Z23 Encounter for immunization: Secondary | ICD-10-CM | POA: Diagnosis not present

## 2019-07-31 DIAGNOSIS — I10 Essential (primary) hypertension: Secondary | ICD-10-CM | POA: Diagnosis not present

## 2019-07-31 DIAGNOSIS — E782 Mixed hyperlipidemia: Secondary | ICD-10-CM | POA: Diagnosis not present

## 2019-09-16 DIAGNOSIS — L80 Vitiligo: Secondary | ICD-10-CM | POA: Diagnosis not present

## 2019-09-16 DIAGNOSIS — D2271 Melanocytic nevi of right lower limb, including hip: Secondary | ICD-10-CM | POA: Diagnosis not present

## 2019-09-16 DIAGNOSIS — D2261 Melanocytic nevi of right upper limb, including shoulder: Secondary | ICD-10-CM | POA: Diagnosis not present

## 2019-09-16 DIAGNOSIS — D2272 Melanocytic nevi of left lower limb, including hip: Secondary | ICD-10-CM | POA: Diagnosis not present

## 2020-02-16 ENCOUNTER — Encounter: Payer: Self-pay | Admitting: Gastroenterology

## 2020-04-27 DIAGNOSIS — E782 Mixed hyperlipidemia: Secondary | ICD-10-CM | POA: Diagnosis not present

## 2020-04-27 DIAGNOSIS — I10 Essential (primary) hypertension: Secondary | ICD-10-CM | POA: Diagnosis not present

## 2020-05-31 ENCOUNTER — Encounter (HOSPITAL_COMMUNITY): Payer: Self-pay

## 2020-05-31 ENCOUNTER — Other Ambulatory Visit: Payer: Self-pay

## 2020-05-31 ENCOUNTER — Emergency Department (HOSPITAL_COMMUNITY)
Admission: EM | Admit: 2020-05-31 | Discharge: 2020-05-31 | Disposition: A | Discharge status: Home / Self Care | Payer: BC Managed Care – PPO | Attending: Emergency Medicine | Admitting: Emergency Medicine

## 2020-05-31 DIAGNOSIS — M9903 Segmental and somatic dysfunction of lumbar region: Secondary | ICD-10-CM | POA: Diagnosis not present

## 2020-05-31 DIAGNOSIS — M9905 Segmental and somatic dysfunction of pelvic region: Secondary | ICD-10-CM | POA: Diagnosis not present

## 2020-05-31 DIAGNOSIS — M545 Low back pain: Secondary | ICD-10-CM | POA: Diagnosis not present

## 2020-05-31 DIAGNOSIS — Z79899 Other long term (current) drug therapy: Secondary | ICD-10-CM | POA: Insufficient documentation

## 2020-05-31 DIAGNOSIS — M5431 Sciatica, right side: Secondary | ICD-10-CM | POA: Diagnosis not present

## 2020-05-31 DIAGNOSIS — M5441 Lumbago with sciatica, right side: Secondary | ICD-10-CM | POA: Diagnosis not present

## 2020-05-31 DIAGNOSIS — I10 Essential (primary) hypertension: Secondary | ICD-10-CM | POA: Insufficient documentation

## 2020-05-31 DIAGNOSIS — M9902 Segmental and somatic dysfunction of thoracic region: Secondary | ICD-10-CM | POA: Diagnosis not present

## 2020-05-31 MED ORDER — KETOROLAC TROMETHAMINE 60 MG/2ML IM SOLN
15.0000 mg | Freq: Once | INTRAMUSCULAR | Status: AC
  Administered 2020-05-31: 15 mg via INTRAMUSCULAR
  Filled 2020-05-31: qty 2

## 2020-05-31 MED ORDER — DIAZEPAM 5 MG PO TABS
5.0000 mg | ORAL_TABLET | Freq: Once | ORAL | Status: AC
  Administered 2020-05-31: 5 mg via ORAL
  Filled 2020-05-31: qty 1

## 2020-05-31 MED ORDER — ACETAMINOPHEN 500 MG PO TABS
1000.0000 mg | ORAL_TABLET | Freq: Once | ORAL | Status: AC
  Administered 2020-05-31: 1000 mg via ORAL
  Filled 2020-05-31: qty 2

## 2020-05-31 MED ORDER — OXYCODONE HCL 5 MG PO TABS
5.0000 mg | ORAL_TABLET | Freq: Once | ORAL | Status: AC
  Administered 2020-05-31: 5 mg via ORAL
  Filled 2020-05-31: qty 1

## 2020-05-31 NOTE — ED Provider Notes (Signed)
[facility] DEPT Provider Note   CSN: [id number] Arrival date & time: 05/31/20  1922     History Chief Complaint  Patient presents with  . Back Pain    Logan Hodge is a 54 y.o. male.  54 yo M with a chief complaints of right-sided low back pain.  Going on for in the past Bhat.  Pain is sharp and shooting radiates down the right leg to the back of the knee.  He was planning on going to see his orthopedic doctor and when he called to make an appointment asked him if he had any numbness and he said that his right knee felt a little bit numb and so they referred him to the ED.  He denies loss of bowel or bladder denies loss peripheral sensation denies fever denies trauma.  He denies weakness to the leg.  Had an episode in the past where he had transient back pain that was diagnosed with sciatica.  Thinks this feels similar.  He saw his chiropractor earlier today and had some improvement of his pain but no recurrence evening.   The history is provided by the patient and the spouse.  Back Pain Location:  Lumbar spine Quality:  Shooting and stabbing Radiates to:  R knee Pain severity:  Severe Onset quality:  Gradual Duration:  1 [place] Timing:  Constant Progression:  Waxing and waning Chronicity:  New Relieved by:  Nothing Worsened by:  Bending, touching, palpation and standing Ineffective treatments:  None tried Associated symptoms: no abdominal pain, no chest pain, no fever and no headaches        Past Medical History:  Diagnosis Date  . Allergic rhinitis   . ED (erectile dysfunction)   . HLD (hyperlipidemia)   . HTN (hypertension)   . Melanoma (Logan Hodge)    hx    Patient Active Problem List   Diagnosis Date Noted  . Dysphagia, idiopathic 03/15/2017  . PUD (peptic ulcer disease) 07/22/2015  . GERD (gastroesophageal reflux disease)   . Colon cancer screening 01/27/2015  . ABNORMAL EKG 05/17/2009  . ERECTILE DYSFUNCTION 04/10/2007  . HYPERLIPIDEMIA  11/07/2006  . HYPERTENSION 11/07/2006  . ALLERGIC RHINITIS 11/07/2006    Past Surgical History:  Procedure Laterality Date  . COLONOSCOPY N/A 02/25/2015   Procedure: COLONOSCOPY;  Surgeon: Danie Binder, MD;  Location: [facility];  Service: Endoscopy;  Laterality: N/A;  915am - moved to 3/24 same time - Candy notified pt  . ESOPHAGOGASTRODUODENOSCOPY N/A 02/25/2015   Procedure: ESOPHAGOGASTRODUODENOSCOPY (EGD);  Surgeon: Danie Binder, MD;  Location: [facility];  Service: Endoscopy;  Laterality: N/A;  . ESOPHAGOGASTRODUODENOSCOPY N/A 08/20/2015   Procedure: ESOPHAGOGASTRODUODENOSCOPY (EGD);  Surgeon: Danie Binder, MD;  Location: [facility];  Service: Endoscopy;  Laterality: N/A;  315 - moved to 9/16 @ 9:30  . HERNIA REPAIR  1997  . LASIK  2007  . LIPOMA EXCISION  2005   L arm  . MELANOMA EXCISION  2004   right shoulder        Family History  Problem Relation Age of Onset  . Colon polyps Brother   . Colon cancer Neg Hx   . Stomach cancer Neg Hx     Social History   Tobacco Use  . Smoking status: Never Smoker  . Smokeless tobacco: Never Used  Substance Use Topics  . Alcohol use: No  . Drug use: No    Home Medications Prior to Admission medications   Medication Sig  Start Date End Date Taking? Authorizing Provider  amLODipine (NORVASC) 2.5 MG tablet Take 2.5 mg by mouth as needed.     [provider]  atorvastatin (LIPITOR) 10 MG tablet Take 10 mg by mouth daily.      [provider]  brimonidine (ALPHAGAN P) 0.1 % SOLN Place 1 drop into both eyes daily as needed.    [provider]  desloratadine (CLARINEX) 5 MG tablet Take 5 mg by mouth daily as needed.      [provider]  losartan-hydrochlorothiazide (HYZAAR) 50-12.5 MG per tablet Take 1 tablet by mouth daily.      [provider]  pantoprazole (PROTONIX) 40 MG tablet 1 PO 30 MINUTES PRIOR TO MEALS QD 03/15/17   Fields, Marga Melnick, MD    Allergies    Moxifloxacin,  Allyl isothiocyanate, Avelox [moxifloxacin hcl in nacl], and Omeprazole  Review of Systems   Review of Systems  Constitutional: Negative for chills and fever.  HENT: Negative for congestion and facial swelling.   Eyes: Negative for discharge and visual disturbance.  Respiratory: Negative for shortness of breath.   Cardiovascular: Negative for chest pain and palpitations.  Gastrointestinal: Negative for abdominal pain, diarrhea and vomiting.  Musculoskeletal: Positive for back pain. Negative for arthralgias and myalgias.  Skin: Negative for color change and rash.  Neurological: Negative for tremors, syncope and headaches.  Psychiatric/Behavioral: Negative for confusion and dysphoric mood.    Physical Exam Updated Vital Signs BP (!) 179/93   Pulse 63   Temp 97.9 F (36.6 C) (Oral)   Resp 20   Ht 6\' 1"  (1.854 m)   Wt 97.5 kg   SpO2 99%   BMI 28.37 kg/m   Physical Exam Vitals and nursing note reviewed.  Constitutional:      Appearance: He is well-developed.  HENT:     Head: Normocephalic and atraumatic.  Eyes:     Pupils: Pupils are equal, round, and reactive to light.  Neck:     Vascular: No JVD.  Cardiovascular:     Rate and Rhythm: Normal rate and regular rhythm.     Heart sounds: No murmur heard.  No friction rub. No gallop.   Pulmonary:     Effort: No respiratory distress.     Breath sounds: No wheezing.  Abdominal:     General: There is no distension.     Tenderness: There is no abdominal tenderness. There is no guarding or rebound.  Musculoskeletal:        General: Normal range of motion.     Cervical back: Normal range of motion and neck supple.     Comments: Pain about the right SI joint.  Pulse motor and sensation intact distally.  Negative straight leg raise test bilaterally.  There is no clonus.  Reflexes are 2+ and equal.  Skin:    Coloration: Skin is not pale.     Findings: No rash.  Neurological:     Mental Status: He is alert and oriented to  person, place, and time.  Psychiatric:        Behavior: Behavior normal.     ED Results / Procedures / Treatments   Labs (all labs ordered are listed, but only abnormal results are displayed) Labs Reviewed - No data to display  EKG None  Radiology No results found.  Procedures Procedures (including critical care time)  Medications Ordered in ED Medications  acetaminophen (TYLENOL) tablet 1,000 mg (1,000 mg Oral Given 05/31/20 2254)  ketorolac (TORADOL) injection  15 mg (15 mg Intramuscular Given 05/31/20 2253)  oxyCODONE (Oxy IR/ROXICODONE) immediate release tablet 5 mg (5 mg Oral Given 05/31/20 2253)  diazepam (VALIUM) tablet 5 mg (5 mg Oral Given 05/31/20 2254)    ED Course  I have reviewed the triage vital signs and the nursing notes.  Pertinent labs & imaging results that were available during my care of the patient were reviewed by me and considered in my medical decision making (see chart for details).    MDM Rules/Calculators/A&P                          54 yo M with a cc of right-sided low back pain that radiates to the knee.  Most consistent with sciatica.  There are no red flags.  Exam without significant finding.  He is ambulatory though with pain.  Will treat his pain here.  Have him do Tylenol and ibuprofen at home.  PCP follow-up.  10:56 PM:  I have discussed the diagnosis/risks/treatment options with the patient and family and believe the pt to be eligible for discharge home to follow-up with PCP. We also discussed returning to the ED immediately if new or worsening sx occur. We discussed the sx which are most concerning (e.g., sudden worsening pain, fever, inability to tolerate by mouth, cauda equina s/sx) that necessitate immediate return. Medications administered to the patient during their visit and any new prescriptions provided to the patient are listed below.  Medications given during this visit Medications  acetaminophen (TYLENOL) tablet 1,000 mg (1,000 mg  Oral Given 05/31/20 2254)  ketorolac (TORADOL) injection 15 mg (15 mg Intramuscular Given 05/31/20 2253)  oxyCODONE (Oxy IR/ROXICODONE) immediate release tablet 5 mg (5 mg Oral Given 05/31/20 2253)  diazepam (VALIUM) tablet 5 mg (5 mg Oral Given 05/31/20 2254)     The patient appears reasonably screen and/or stabilized for discharge and I doubt any other medical condition or other [facility] requiring further screening, evaluation, or treatment in the ED at this time prior to discharge.   Final Clinical Impression(s) / ED Diagnoses Final diagnoses:  Sciatica of right side    Rx / DC Orders ED Discharge Orders    None       Deno Etienne, DO 05/31/20 2256

## 2020-05-31 NOTE — ED Triage Notes (Signed)
Pt sts lower back pain radiating down right leg. Seen chiropractor but had no relief.

## 2020-05-31 NOTE — Discharge Instructions (Signed)
Your back pain is most likely due to a muscular strain.  There is been a lot of research on back pain, unfortunately the only thing that seems to really help is Tylenol and ibuprofen.  Relative rest is also important to not lift greater than 10 pounds bending or twisting at the waist.  Please follow-up with your family physician.  The other thing that really seems to benefit patients is physical therapy which your doctor may send you for.  Please return to the emergency department for new numbness or weakness to your arms or legs. Difficulty with urinating or urinating or pooping on yourself.  Also if you cannot feel toilet paper when you wipe or get a fever.   Take 4 over the counter ibuprofen tablets 3 times a [place] or 2 over-the-counter naproxen tablets twice a [place] for pain. Also take tylenol 1000mg(2 extra strength) four times a [place].   

## 2020-06-01 DIAGNOSIS — M9905 Segmental and somatic dysfunction of pelvic region: Secondary | ICD-10-CM | POA: Diagnosis not present

## 2020-06-01 DIAGNOSIS — M9903 Segmental and somatic dysfunction of lumbar region: Secondary | ICD-10-CM | POA: Diagnosis not present

## 2020-06-01 DIAGNOSIS — M5431 Sciatica, right side: Secondary | ICD-10-CM | POA: Diagnosis not present

## 2020-06-01 DIAGNOSIS — M9902 Segmental and somatic dysfunction of thoracic region: Secondary | ICD-10-CM | POA: Diagnosis not present

## 2020-06-04 DIAGNOSIS — M9903 Segmental and somatic dysfunction of lumbar region: Secondary | ICD-10-CM | POA: Diagnosis not present

## 2020-06-04 DIAGNOSIS — M5431 Sciatica, right side: Secondary | ICD-10-CM | POA: Diagnosis not present

## 2020-06-04 DIAGNOSIS — M9902 Segmental and somatic dysfunction of thoracic region: Secondary | ICD-10-CM | POA: Diagnosis not present

## 2020-06-04 DIAGNOSIS — M9905 Segmental and somatic dysfunction of pelvic region: Secondary | ICD-10-CM | POA: Diagnosis not present

## 2020-06-08 ENCOUNTER — Telehealth: Payer: Self-pay | Admitting: Orthopaedic Surgery

## 2020-06-08 DIAGNOSIS — M25561 Pain in right knee: Secondary | ICD-10-CM | POA: Insufficient documentation

## 2020-06-08 DIAGNOSIS — M5416 Radiculopathy, lumbar region: Secondary | ICD-10-CM | POA: Diagnosis not present

## 2020-06-08 DIAGNOSIS — M79651 Pain in right thigh: Secondary | ICD-10-CM | POA: Diagnosis not present

## 2020-06-08 DIAGNOSIS — M545 Low back pain: Secondary | ICD-10-CM | POA: Diagnosis not present

## 2020-06-08 NOTE — Telephone Encounter (Signed)
Patient/spouse called back to relay that he was able to get into an orthopaedist's office in [place] after talking with his primary care office. Thanked [place].

## 2020-06-08 NOTE — Telephone Encounter (Signed)
Call received from patient following emergency room visit, Logan Hodge, on 05/31/20, for sciatica - states pain is still increasing; no relief yet; no Xrays at time of emergency visit.  Aware to also contact primary care; hoping for appointment today. If unable to see primary care, will call back.

## 2020-06-11 DIAGNOSIS — M545 Low back pain: Secondary | ICD-10-CM | POA: Diagnosis not present

## 2020-06-15 DIAGNOSIS — M545 Low back pain: Secondary | ICD-10-CM | POA: Diagnosis not present

## 2020-06-15 DIAGNOSIS — M5416 Radiculopathy, lumbar region: Secondary | ICD-10-CM | POA: Diagnosis not present

## 2020-06-18 ENCOUNTER — Ambulatory Visit: Payer: Self-pay | Admitting: Orthopedic Surgery

## 2020-06-25 ENCOUNTER — Encounter (HOSPITAL_COMMUNITY): Payer: Self-pay

## 2020-06-25 DIAGNOSIS — M545 Low back pain: Secondary | ICD-10-CM | POA: Diagnosis not present

## 2020-06-25 NOTE — Pre-Procedure Instructions (Signed)
   Logan Hodge  06/25/2020      Your procedure is scheduled on Wednesday, June 30, 2020  Report to [facility] Admitting at 9:30 A.M.  Call this number if you have problems the morning of surgery:  [phone]   Remember: Brush your teeth the morning of surgery with your regular toothpaste.   Do not eat or drink after midnight the night before surgery.    Take these medicines the morning of surgery with A SIP OF WATER :  gabapentin (NEURONTIN)   If needed: traMADol (ULTRAM) for pain  If needed: Tylenol for pain  Stop taking Aspirin (unless otherwise advised by surgeon), vitamins, fish oil and herbal medications. Do not take any NSAIDs ie: Ibuprofen, Advil, Naproxen (Aleve), Motrin, BC and Goody Powder; stop now.   Do not wear jewelry  Do not wear lotions, powders, or perfumes, or deodorant.  Do not shave 48 hours prior to surgery.  Men may shave face and neck.  Do not bring valuables to the hospital.  [facility] is not responsible for any belongings or valuables.  Contacts, dentures or bridgework may not be worn into surgery.  Leave your suitcase in the car.  After surgery it may be brought to your room.  For patients admitted to the hospital, discharge time will be determined by your treatment team.  Patients discharged the [place] of surgery will not be allowed to drive home.   Special instructions: See " [facility] Preparing For Surgery" sheet.   Please read over the following fact sheets that you were given. Pain Booklet, Coughing and Deep Breathing and Surgical Site Infection Prevention

## 2020-06-28 ENCOUNTER — Other Ambulatory Visit: Payer: Self-pay

## 2020-06-28 ENCOUNTER — Encounter (HOSPITAL_COMMUNITY)
Admission: RE | Admit: 2020-06-28 | Discharge: 2020-06-28 | Disposition: A | Discharge status: Home / Self Care | Payer: BC Managed Care – PPO | Source: Ambulatory Visit | Attending: Orthopedic Surgery | Admitting: Orthopedic Surgery

## 2020-06-28 ENCOUNTER — Encounter (HOSPITAL_COMMUNITY): Payer: Self-pay

## 2020-06-28 ENCOUNTER — Other Ambulatory Visit (HOSPITAL_COMMUNITY)
Admission: RE | Admit: 2020-06-28 | Discharge: 2020-06-28 | Disposition: A | Discharge status: Home / Self Care | Payer: BC Managed Care – PPO | Source: Ambulatory Visit | Attending: Orthopedic Surgery | Admitting: Orthopedic Surgery

## 2020-06-28 ENCOUNTER — Ambulatory Visit (HOSPITAL_COMMUNITY)
Admission: RE | Admit: 2020-06-28 | Discharge: 2020-06-28 | Disposition: A | Discharge status: Home / Self Care | Payer: BC Managed Care – PPO | Source: Ambulatory Visit | Attending: Orthopedic Surgery | Admitting: Orthopedic Surgery

## 2020-06-28 DIAGNOSIS — Z20822 Contact with and (suspected) exposure to covid-19: Secondary | ICD-10-CM | POA: Diagnosis not present

## 2020-06-28 DIAGNOSIS — I10 Essential (primary) hypertension: Secondary | ICD-10-CM | POA: Insufficient documentation

## 2020-06-28 DIAGNOSIS — M5126 Other intervertebral disc displacement, lumbar region: Secondary | ICD-10-CM | POA: Insufficient documentation

## 2020-06-28 DIAGNOSIS — Z79899 Other long term (current) drug therapy: Secondary | ICD-10-CM | POA: Diagnosis not present

## 2020-06-28 DIAGNOSIS — Z01818 Encounter for other preprocedural examination: Secondary | ICD-10-CM | POA: Insufficient documentation

## 2020-06-28 DIAGNOSIS — E785 Hyperlipidemia, unspecified: Secondary | ICD-10-CM | POA: Diagnosis not present

## 2020-06-28 DIAGNOSIS — Z7901 Long term (current) use of anticoagulants: Secondary | ICD-10-CM | POA: Insufficient documentation

## 2020-06-28 HISTORY — DX: Radiculopathy, lumbar region: M54.16

## 2020-06-28 HISTORY — DX: Pneumonia, unspecified organism: J18.9

## 2020-06-28 HISTORY — DX: Gastro-esophageal reflux disease without esophagitis: K21.9

## 2020-06-28 LAB — URINALYSIS, ROUTINE W REFLEX MICROSCOPIC
Bacteria, UA: NONE SEEN
Bilirubin Urine: NEGATIVE
Glucose, UA: NEGATIVE mg/dL
Ketones, ur: NEGATIVE mg/dL
Leukocytes,Ua: NEGATIVE
Nitrite: NEGATIVE
Protein, ur: NEGATIVE mg/dL
Specific Gravity, Urine: 1.025 (ref 1.005–1.030)
pH: 5 (ref 5.0–8.0)

## 2020-06-28 LAB — BASIC METABOLIC PANEL
Anion gap: 8 (ref 5–15)
BUN: 15 mg/dL (ref 6–20)
CO2: 28 mmol/L (ref 22–32)
Calcium: 9.5 mg/dL (ref 8.9–10.3)
Chloride: 101 mmol/L (ref 98–111)
Creatinine, Ser: 0.88 mg/dL (ref 0.61–1.24)
GFR calc Af Amer: >60 mL/min (ref >60)
GFR calc non Af Amer: >60 mL/min (ref >60)
Glucose, Bld: 97 mg/dL (ref 70–99)
Potassium: 3.9 mmol/L (ref 3.5–5.1)
Sodium: 137 mmol/L (ref 135–145)

## 2020-06-28 LAB — PROTIME-INR
INR: 1 (ref 0.8–1.2)
Prothrombin Time: 12.4 seconds (ref 11.4–15.2)

## 2020-06-28 LAB — CBC
HCT: 50.6 % (ref 39.0–52.0)
Hemoglobin: 16.7 g/dL (ref 13.0–17.0)
MCH: 29.1 pg (ref 26.0–34.0)
MCHC: 33 g/dL (ref 30.0–36.0)
MCV: 88.3 fL (ref 80.0–100.0)
Platelets: 242 10*3/uL (ref 150–400)
RBC: 5.73 MIL/uL (ref 4.22–5.81)
RDW: 11.9 % (ref 11.5–15.5)
WBC: 6.6 10*3/uL (ref 4.0–10.5)
nRBC: 0 % (ref 0.0–0.2)

## 2020-06-28 LAB — SARS CORONAVIRUS 2 (TAT 6-24 HRS): SARS Coronavirus 2: NEGATIVE

## 2020-06-28 LAB — APTT: aPTT: 24 seconds (ref 24–36)

## 2020-06-28 LAB — SURGICAL PCR SCREEN
MRSA, PCR: NEGATIVE
Staphylococcus aureus: NEGATIVE

## 2020-06-28 NOTE — Progress Notes (Signed)
Pt denies SOB, chest pain, and being under the care of a cardiologist. Pt stated that he receives primary care at [facility]. PCP is Vicenta Aly, NP. Pt denies having a stress test, echo and cardiac cath. Pt denies having an EKG and chest x ray. Pt denies recent labs. Pt stated that he did not take his BP medications this morning " I usually take them in the afternoon. "  Pt reminded to quarantine. Pt chart forwarded to PA, Anesthesiology,  for " Consult. "

## 2020-06-29 ENCOUNTER — Ambulatory Visit: Payer: Self-pay | Admitting: Orthopedic Surgery

## 2020-06-29 NOTE — Anesthesia Preprocedure Evaluation (Addendum)
Anesthesia Evaluation  Patient identified by MRN, date of birth, ID band Patient awake    Reviewed: Allergy & Precautions, NPO status , Patient's Chart, lab work & pertinent test results  Airway Mallampati: I  TM Distance: >3 FB Neck ROM: Full    Dental no notable dental hx. (+) Teeth Intact, Dental Advisory Given   Pulmonary    Pulmonary exam normal breath sounds clear to auscultation       Cardiovascular hypertension, Pt. on medications Normal cardiovascular exam Rhythm:Regular Rate:Normal     Neuro/Psych    GI/Hepatic GERD  Medicated,  Endo/Other    Renal/GU      Musculoskeletal   Abdominal   Peds  Hematology   Anesthesia Other Findings   Reproductive/Obstetrics                             Anesthesia Physical Anesthesia Plan  ASA: II  Anesthesia Plan: General   Post-op Pain Management:    Induction: Intravenous  PONV Risk Score and Plan: 2 and Ondansetron and Dexamethasone  Airway Management Planned: Oral ETT  Additional Equipment:   Intra-op Plan:   Post-operative Plan: Extubation in OR  Informed Consent:   Plan Discussed with: Anesthesiologist and CRNA  Anesthesia Plan Comments: (PAT note written 06/29/2020 by Myra Gianotti, PA-C. )       Anesthesia Quick Evaluation

## 2020-06-29 NOTE — H&P (Signed)
Subjective:   Logan Hodge is a pleasant 54 year old male who was in his normal state of health until approximately 05/31/2020 when he began experiencing back pain and pain in the right anterior thigh. He was seen at our urgent care and prescribed tramadol, Robaxin, as well as a Medrol Dosepak. Despite these medications, he is having significant back pain and radicular leg pain. He is here today to follow-up after his MRI which was ordered by her access ortho.  Patient Active Problem List   Diagnosis Date Noted  . Dysphagia, idiopathic 03/15/2017  . PUD (peptic ulcer disease) 07/22/2015  . GERD (gastroesophageal reflux disease)   . Colon cancer screening 01/27/2015  . ABNORMAL EKG 05/17/2009  . ERECTILE DYSFUNCTION 04/10/2007  . HYPERLIPIDEMIA 11/07/2006  . HYPERTENSION 11/07/2006  . ALLERGIC RHINITIS 11/07/2006   Past Medical History:  Diagnosis Date  . Allergic rhinitis   . ED (erectile dysfunction)   . GERD (gastroesophageal reflux disease)   . HLD (hyperlipidemia)   . HTN (hypertension)   . Lumbar radiculopathy    lumbar foraminal disc herniation  . Melanoma ([place])    hx  . Pneumonia     Past Surgical History:  Procedure Laterality Date  . COLONOSCOPY N/A 02/25/2015   Procedure: COLONOSCOPY;  Surgeon: Danie Binder, MD;  [facility];  Service: Endoscopy;  Laterality: N/A;  915am - moved to 3/24 same time - Candy notified pt  . ESOPHAGOGASTRODUODENOSCOPY N/A 02/25/2015   Procedure: ESOPHAGOGASTRODUODENOSCOPY (EGD);  Surgeon: Danie Binder, MD;  [facility];  Service: Endoscopy;  Laterality: N/A;  . ESOPHAGOGASTRODUODENOSCOPY N/A 08/20/2015   Procedure: ESOPHAGOGASTRODUODENOSCOPY (EGD);  Surgeon: Danie Binder, MD;  [facility];  Service: Endoscopy;  Laterality: N/A;  315 - moved to 9/16 @ 9:30  . HERNIA REPAIR  1997  . LASIK  2007  . LIPOMA EXCISION  2005   L arm  . MELANOMA EXCISION  2004   right shoulder   . WISDOM TOOTH EXTRACTION       Current Outpatient Medications  Medication Sig Dispense Refill Last Dose  . acetaminophen (TYLENOL) 500 MG tablet Take 500 mg by mouth every 6 (six) hours as needed. Pt takes 2 tabs+ 1000 mg     . atorvastatin (LIPITOR) 10 MG tablet Take 10 mg by mouth daily with lunch.      . gabapentin (NEURONTIN) 300 MG capsule Take 300 mg by mouth 3 (three) times daily.     . hydrochlorothiazide (MICROZIDE) 12.5 MG capsule Take 12.5 mg by mouth daily with lunch.      . ibuprofen (ADVIL) 200 MG tablet Take 400 mg by mouth every 6 (six) hours as needed (for pain).     Marland Kitchen losartan (COZAAR) 50 MG tablet Take 50 mg by mouth daily with lunch.      . traMADol (ULTRAM) 50 MG tablet Take 50 mg by mouth every 12 (twelve) hours as needed (intense pain.).     Marland Kitchen vitamin B-12 (CYANOCOBALAMIN) 1000 MCG tablet Take 1,000 mcg by mouth daily.      No current facility-administered medications for this visit.   Allergies  Allergen Reactions  . Moxifloxacin Itching and Rash  . Avelox [Moxifloxacin Hcl In Nacl] Itching    Social History   Tobacco Use  . Smoking status: Never Smoker  . Smokeless tobacco: Never Used  Substance Use Topics  . Alcohol use: No    Family History  Problem Relation Age of Onset  . Colon polyps Brother   .  Non-Hodgkin's lymphoma Father   . Colon cancer Neg Hx   . Stomach cancer Neg Hx     Review of Systems As stated in HPI  Objective:   General: AAOX3, well developed and well nourished, standing/pacing unable to find comfortable position Ambulation: antalgic gait pattern, uses no assistive device. Inspection: No obvious deformity, scoleosis, kyphosis, loss of lordotic curve. Palpation: Non-tender over spinous processes and paraspinal muscles. AROM: - Knee: flexion and extension normal and pain free bilaterally. - Ankle: Dorsiflexion, plantarflexion, inversion, eversion normal and pain free. Dermatomes: Lower extremity sensation to light touch abnormal in right L3 dermatoe  pattern Myotomes: - Hip Flexion: Left 5/5, Right 5/5 - Knee Extension: Left 5/5, Right 5/5 - Ankle Dorsiflextion: Left 5/5, Right 5/5 - Ankle Plantarflexion: Left 5/5, Right 5/5 Reflexes: - Patella: Left2+, Right 2+ - Achilles: Left2+, Right 2+ - Babinski: Left Ngative, Right Negative - Clonus: Negative Special Tests: - Straight Leg Raise: Left Negative, Right Negative - Femoral Nerve Stretch Test: Left Negative, Right Positive  PV: Extremities warm and well profused. Posterior and dorsalis pedis pulse 2+ bilaterally, No pitting Edema, discoloration, calf tenderness  MRI Impression: MRI of lumbar spine taken at [facility] dated 06/14/2020. Both images as well as report were personally reviewwed by me. I think the patient's primary pain generator is a large right foraminal/extraforaminal disc protrusion at L3-4 causing stenosis and abutment of the exiting right L3 nerve root.  Assessment:   Logan Hodge is a pleasant 54 year old male who was in his normal state of health until approximately 05/31/2020 when he began experiencing back pain and pain in the right anterior thigh. Patient describes pain in the right anterior thigh. Imaging studies show a larrge foraminal/extraforaminal disc protrusion at L3-4 with abutment of the exiting L3 nerve root. this does correlate with the patient's symptoms. Fortunately, the patient is neurologically intactt with 5/5 lower extremity motor strength bilaterally. Positive femoral stretch test. Based on the size of the disc protrusion, I am not optimistic that injection therapy would provide any significant relief and therefore would recommend surgical intervention.   Plan:   Far lateral lumbar discectomy at L3-4.  Risks and benefits of surgery were discussed with the patient. These include: Infection, bleeding, death, stroke, paralysis, ongoing or worse pain, need for additional surgery, leak of spinal fluid, adjacent segment degeneration requiring  additional surgery, post-operative hematoma formation that can result in neurological compromise and the need for urgent/emergent re-operation. Loss in bowel and bladder control. Injury to major vessels that could result in the need for urgent abdominal surgery to stop bleeding. Risk of deep venous thrombosis (DVT) and the need for additional treatment. Recurrent disc herniation resulting in the need for revision surgery, which could include fusion surgery (utilizing instrumentation such as pedicle screws and intervertebral cages).  Additional risk: If instrumentation is used there is a risk of migration, or breakage of that hardware that could require additional surgery.  We will obtain clearance from the patient's primary care provider and insurance approval.  2. For the patient's radicular nerve pain I have prescribed gabapentin. We have also discussed the side effects of this medication including, but not limited to grogginess. We discussed that the patient should Contact the office if he/she has extreme sleepiness/grogginess or any other adverse reactions. I did discuss with the patient that The dose of this medication should be increased As well as discontinued at a gradual rate And that he/she should contact the office with any questions regarding this. Patient expressed understanding of  this.  Follow-up: For preop H&P  Note dictated by Cleta Alberts PA-C, patient was seen in conjunction with Dr. Rolena Infante.  Addendum: By Dr. Rolena Infante Diagnosis: Harriott is a pleasant 54 year old gentleman with significant back buttock and radicular right leg pain. Imaging studies clearly show a far lateral disc herniation at L3-4 with compression of the exiting L3 nerve root. Given the size of the disc herniation I do not think injection therapy will be of benefit to him. Patient has severe debilitating radicular leg pain would like to move forward with surgery. Treatment plan: I have gone over the risks and benefits of  surgery with him in great detail and all of his and his wife's questions were addressed. We will plan on moving forward with surgery sometime in the near future. Surgical plan: Right L3-4 foraminotomy and discectomy.

## 2020-06-29 NOTE — Progress Notes (Signed)
Anesthesia Chart Review:  Case: [id number] Date/Time: 06/30/20 1115   Procedure: Right L3-4 foraminal discectomy (Right ) - 2 hrs   Anesthesia type: General   Pre-op diagnosis: L3-4 foraminal right herniated disc   Location: [facility] OR ROOM 04 / [facility]   Surgeons: Melina Schools, MD      DISCUSSION: Patient is a 54 year old male scheduled for the above procedure.  History includes never smoker, HTN, HLD, GERD, melanoma (s/p excision 2004), hernia repair (1997).   Patient's BP was 150/102 at PAT. He had not taken BP meds yet that [place] (on HCTZ and losartan). Review of available BP trends over the past year: 136-155/82-88. He will get vitals on the [place] of surgery. He is not a smoker.  He denies shortness of breath and chest pain.  06/28/20 presurgical COVID-19 test negative.  Anesthesia team to evaluate on the [place] of surgery.   VS: BP (!) 150/102   Pulse 95   Temp 36.8 C (Oral)   Resp 20   Ht 6\' 1"  (1.854 m)   Wt (!) 95 kg   SpO2 97%   BMI 27.63 kg/m  Patient  BP 05/31/20: 155/88 [facility] ED) BP 04/27/20: 140/82 ([place]) BP 07/31/19: 136/86 ([facility])   PROVIDERS: Brunetta Jeans, PA-C is listed as PCP, reported as Vicenta Aly, NP. Last visit by Nicholes Rough, PA-C on 04/27/20 at [place] for HTN/HLD follow-up. See [facility].   LABS: Labs reviewed: Acceptable for surgery. (all labs ordered are listed, but only abnormal results are displayed)  Labs Reviewed  URINALYSIS, ROUTINE W REFLEX MICROSCOPIC - Abnormal; Notable for the following components:      Result Value   Hgb urine dipstick MODERATE (*)    All other components within normal limits  SURGICAL PCR SCREEN  APTT  BASIC METABOLIC PANEL  CBC  PROTIME-INR    IMAGES: CXR 06/28/20: FINDINGS: The heart size and mediastinal contours are within normal limits. Both lungs are clear. The visualized skeletal structures are unremarkable. IMPRESSION: No active cardiopulmonary  disease.   EKG: 06/28/20: Normal sinus rhythm Possible Left atrial enlargement Borderline ECG No previous tracing Confirmed by Kirk Ruths [phone]) on 06/28/2020 1:12:37 PM   CV: Denied prior stress test, echocardiogram and cardiac cath.   Past Medical History:  Diagnosis Date  . Allergic rhinitis   . ED (erectile dysfunction)   . GERD (gastroesophageal reflux disease)   . HLD (hyperlipidemia)   . HTN (hypertension)   . Lumbar radiculopathy    lumbar foraminal disc herniation  . Melanoma ([place])    hx  . Pneumonia     Past Surgical History:  Procedure Laterality Date  . COLONOSCOPY N/A 02/25/2015   Procedure: COLONOSCOPY;  Surgeon: Danie Binder, MD;  [facility];  Service: Endoscopy;  Laterality: N/A;  915am - moved to 3/24 same time - Candy notified pt  . ESOPHAGOGASTRODUODENOSCOPY N/A 02/25/2015   Procedure: ESOPHAGOGASTRODUODENOSCOPY (EGD);  Surgeon: Danie Binder, MD;  [facility];  Service: Endoscopy;  Laterality: N/A;  . ESOPHAGOGASTRODUODENOSCOPY N/A 08/20/2015   Procedure: ESOPHAGOGASTRODUODENOSCOPY (EGD);  Surgeon: Danie Binder, MD;  [facility];  Service: Endoscopy;  Laterality: N/A;  315 - moved to 9/16 @ 9:30  . HERNIA REPAIR  1997  . LASIK  2007  . LIPOMA EXCISION  2005   L arm  . MELANOMA EXCISION  2004   right shoulder   . WISDOM TOOTH EXTRACTION  MEDICATIONS: . acetaminophen (TYLENOL) 500 MG tablet  . atorvastatin (LIPITOR) 10 MG tablet  . gabapentin (NEURONTIN) 300 MG capsule  . hydrochlorothiazide (MICROZIDE) 12.5 MG capsule  . ibuprofen (ADVIL) 200 MG tablet  . losartan (COZAAR) 50 MG tablet  . traMADol (ULTRAM) 50 MG tablet  . vitamin B-12 (CYANOCOBALAMIN) 1000 MCG tablet   No current facility-administered medications for this encounter.    Myra Gianotti, PA-C Surgical Short Stay/Anesthesiology [facility] Phone [phone] [facility] Phone [phone] 06/29/2020 11:48 AM

## 2020-06-29 NOTE — H&P (Deleted)
  The note originally documented on this encounter has been moved the the encounter in which it belongs.  

## 2020-06-30 ENCOUNTER — Ambulatory Visit (HOSPITAL_COMMUNITY): Payer: BC Managed Care – PPO | Admitting: Physician Assistant

## 2020-06-30 ENCOUNTER — Ambulatory Visit (HOSPITAL_COMMUNITY)
Admission: RE | Admit: 2020-06-30 | Discharge: 2020-06-30 | Disposition: A | Discharge status: Home / Self Care | Payer: BC Managed Care – PPO | Attending: Orthopedic Surgery | Admitting: Orthopedic Surgery

## 2020-06-30 ENCOUNTER — Ambulatory Visit (HOSPITAL_COMMUNITY): Payer: BC Managed Care – PPO | Admitting: Anesthesiology

## 2020-06-30 ENCOUNTER — Encounter (HOSPITAL_COMMUNITY): Payer: Self-pay | Admitting: Orthopedic Surgery

## 2020-06-30 ENCOUNTER — Ambulatory Visit (HOSPITAL_COMMUNITY)
Admission: RE | Disposition: A | Discharge status: Home / Self Care | Payer: Self-pay | Source: Home / Self Care | Attending: Orthopedic Surgery

## 2020-06-30 ENCOUNTER — Ambulatory Visit (HOSPITAL_COMMUNITY): Payer: BC Managed Care – PPO

## 2020-06-30 ENCOUNTER — Other Ambulatory Visit: Payer: Self-pay

## 2020-06-30 DIAGNOSIS — E785 Hyperlipidemia, unspecified: Secondary | ICD-10-CM | POA: Insufficient documentation

## 2020-06-30 DIAGNOSIS — M5116 Intervertebral disc disorders with radiculopathy, lumbar region: Secondary | ICD-10-CM | POA: Insufficient documentation

## 2020-06-30 DIAGNOSIS — M5126 Other intervertebral disc displacement, lumbar region: Secondary | ICD-10-CM | POA: Diagnosis not present

## 2020-06-30 DIAGNOSIS — I10 Essential (primary) hypertension: Secondary | ICD-10-CM | POA: Diagnosis not present

## 2020-06-30 DIAGNOSIS — M5106 Intervertebral disc disorders with myelopathy, lumbar region: Secondary | ICD-10-CM | POA: Insufficient documentation

## 2020-06-30 DIAGNOSIS — Z419 Encounter for procedure for purposes other than remedying health state, unspecified: Secondary | ICD-10-CM

## 2020-06-30 DIAGNOSIS — Z79899 Other long term (current) drug therapy: Secondary | ICD-10-CM | POA: Diagnosis not present

## 2020-06-30 DIAGNOSIS — Z981 Arthrodesis status: Secondary | ICD-10-CM | POA: Diagnosis not present

## 2020-06-30 DIAGNOSIS — N529 Male erectile dysfunction, unspecified: Secondary | ICD-10-CM | POA: Diagnosis not present

## 2020-06-30 DIAGNOSIS — K219 Gastro-esophageal reflux disease without esophagitis: Secondary | ICD-10-CM | POA: Diagnosis not present

## 2020-06-30 HISTORY — PX: LUMBAR LAMINECTOMY/DECOMPRESSION MICRODISCECTOMY: SHX5026

## 2020-06-30 SURGERY — LUMBAR LAMINECTOMY/DECOMPRESSION MICRODISCECTOMY 1 LEVEL
Anesthesia: General | Laterality: Right

## 2020-06-30 MED ORDER — ROCURONIUM BROMIDE 10 MG/ML (PF) SYRINGE
PREFILLED_SYRINGE | INTRAVENOUS | Status: DC | PRN
  Administered 2020-06-30: 10 mg via INTRAVENOUS
  Administered 2020-06-30: 60 mg via INTRAVENOUS

## 2020-06-30 MED ORDER — FENTANYL CITRATE (PF) 100 MCG/2ML IJ SOLN: 100.0000 ug | Freq: Once | INTRAMUSCULAR | Status: AC

## 2020-06-30 MED ORDER — ONDANSETRON HCL 4 MG/2ML IJ SOLN
INTRAMUSCULAR | Status: DC | PRN
  Administered 2020-06-30: 4 mg via INTRAVENOUS

## 2020-06-30 MED ORDER — BUPIVACAINE LIPOSOME 1.3 % IJ SUSP
INTRAMUSCULAR | Status: DC | PRN
  Administered 2020-06-30: 10 mL

## 2020-06-30 MED ORDER — BUPIVACAINE-EPINEPHRINE 0.25% -1:200000 IJ SOLN
INTRAMUSCULAR | Status: DC | PRN
  Administered 2020-06-30: 17 mL

## 2020-06-30 MED ORDER — METHOCARBAMOL 1000 MG/10ML IJ SOLN: 500.0000 mg | Freq: Four times a day (QID) | INTRAMUSCULAR | Status: DC | PRN

## 2020-06-30 MED ORDER — METHYLPREDNISOLONE ACETATE 40 MG/ML INJ SUSP (RADIOLOG
INTRAMUSCULAR | Status: DC | PRN
  Administered 2020-06-30: 40 mg

## 2020-06-30 MED ORDER — CEFAZOLIN SODIUM-DEXTROSE 2-4 GM/100ML-% IV SOLN
INTRAVENOUS | Status: AC
  Filled 2020-06-30: qty 100

## 2020-06-30 MED ORDER — ACETAMINOPHEN 325 MG PO TABS: 650.0000 mg | ORAL_TABLET | ORAL | Status: DC | PRN

## 2020-06-30 MED ORDER — FENTANYL CITRATE (PF) 250 MCG/5ML IJ SOLN
INTRAMUSCULAR | Status: AC
  Filled 2020-06-30: qty 5

## 2020-06-30 MED ORDER — SODIUM CHLORIDE 0.9 % IV SOLN
250.0000 mL | INTRAVENOUS | Status: DC
Start: 2020-06-30 — End: 2020-06-30

## 2020-06-30 MED ORDER — OXYCODONE HCL 5 MG PO TABS
10.0000 mg | ORAL_TABLET | ORAL | Status: DC | PRN
Start: 2020-06-30 — End: 2020-06-30

## 2020-06-30 MED ORDER — 0.9 % SODIUM CHLORIDE (POUR BTL) OPTIME
TOPICAL | Status: DC | PRN
  Administered 2020-06-30: 1000 mL

## 2020-06-30 MED ORDER — ONDANSETRON HCL 4 MG/2ML IJ SOLN: 4.0000 mg | Freq: Four times a day (QID) | INTRAMUSCULAR | Status: DC | PRN

## 2020-06-30 MED ORDER — MIDAZOLAM HCL 2 MG/2ML IJ SOLN
INTRAMUSCULAR | Status: AC
  Filled 2020-06-30: qty 2

## 2020-06-30 MED ORDER — HEMOSTATIC AGENTS (NO CHARGE) OPTIME
TOPICAL | Status: DC | PRN
  Administered 2020-06-30: 1 via TOPICAL

## 2020-06-30 MED ORDER — ACETAMINOPHEN 10 MG/ML IV SOLN
INTRAVENOUS | Status: AC
  Filled 2020-06-30: qty 100

## 2020-06-30 MED ORDER — FENTANYL CITRATE (PF) 100 MCG/2ML IJ SOLN
INTRAMUSCULAR | Status: AC
  Administered 2020-06-30: 100 ug via INTRAVENOUS
  Filled 2020-06-30: qty 2

## 2020-06-30 MED ORDER — ACETAMINOPHEN 160 MG/5ML PO SOLN: 325.0000 mg | ORAL | Status: DC | PRN

## 2020-06-30 MED ORDER — FENTANYL CITRATE (PF) 250 MCG/5ML IJ SOLN
INTRAMUSCULAR | Status: DC | PRN
  Administered 2020-06-30 (×2): 50 ug via INTRAVENOUS
  Administered 2020-06-30: 100 ug via INTRAVENOUS
  Administered 2020-06-30: 50 ug via INTRAVENOUS

## 2020-06-30 MED ORDER — ONDANSETRON HCL 4 MG/2ML IJ SOLN
INTRAMUSCULAR | Status: AC
  Filled 2020-06-30: qty 2

## 2020-06-30 MED ORDER — MEPERIDINE HCL 25 MG/ML IJ SOLN: 6.2500 mg | INTRAMUSCULAR | Status: DC | PRN

## 2020-06-30 MED ORDER — BUPIVACAINE-EPINEPHRINE (PF) 0.25% -1:200000 IJ SOLN
INTRAMUSCULAR | Status: AC
  Filled 2020-06-30: qty 20

## 2020-06-30 MED ORDER — PROPOFOL 10 MG/ML IV BOLUS
INTRAVENOUS | Status: AC
  Filled 2020-06-30: qty 20

## 2020-06-30 MED ORDER — METHOCARBAMOL 500 MG PO TABS: 500.0000 mg | ORAL_TABLET | Freq: Three times a day (TID) | ORAL | 0 refills | Status: AC | PRN

## 2020-06-30 MED ORDER — ACETAMINOPHEN 325 MG PO TABS: 325.0000 mg | ORAL_TABLET | ORAL | Status: DC | PRN

## 2020-06-30 MED ORDER — CHLORHEXIDINE GLUCONATE 0.12 % MT SOLN
OROMUCOSAL | Status: AC
  Administered 2020-06-30: 15 mL
  Filled 2020-06-30: qty 15

## 2020-06-30 MED ORDER — ONDANSETRON HCL 4 MG PO TABS: 4.0000 mg | ORAL_TABLET | Freq: Four times a day (QID) | ORAL | Status: DC | PRN

## 2020-06-30 MED ORDER — METHOCARBAMOL 500 MG PO TABS: 500.0000 mg | ORAL_TABLET | Freq: Four times a day (QID) | ORAL | Status: DC | PRN

## 2020-06-30 MED ORDER — LIDOCAINE 2% (20 MG/ML) 5 ML SYRINGE
INTRAMUSCULAR | Status: DC | PRN
  Administered 2020-06-30: 100 mg via INTRAVENOUS

## 2020-06-30 MED ORDER — PHENYLEPHRINE 40 MCG/ML (10ML) SYRINGE FOR IV PUSH (FOR BLOOD PRESSURE SUPPORT)
PREFILLED_SYRINGE | INTRAVENOUS | Status: AC
  Filled 2020-06-30: qty 10

## 2020-06-30 MED ORDER — OXYCODONE-ACETAMINOPHEN 5-325 MG PO TABS
2.0000 | ORAL_TABLET | Freq: Once | ORAL | Status: AC
  Administered 2020-06-30: 2 via ORAL

## 2020-06-30 MED ORDER — CEFAZOLIN SODIUM-DEXTROSE 2-4 GM/100ML-% IV SOLN
2.0000 g | INTRAVENOUS | Status: AC
  Administered 2020-06-30: 2 g via INTRAVENOUS

## 2020-06-30 MED ORDER — CEFAZOLIN SODIUM-DEXTROSE 1-4 GM/50ML-% IV SOLN: 1.0000 g | Freq: Three times a day (TID) | INTRAVENOUS | Status: DC

## 2020-06-30 MED ORDER — PROPOFOL 10 MG/ML IV BOLUS
INTRAVENOUS | Status: DC | PRN
  Administered 2020-06-30: 200 mg via INTRAVENOUS

## 2020-06-30 MED ORDER — OXYCODONE HCL 5 MG PO TABS: 5.0000 mg | ORAL_TABLET | ORAL | Status: DC | PRN

## 2020-06-30 MED ORDER — SUGAMMADEX SODIUM 200 MG/2ML IV SOLN
INTRAVENOUS | Status: DC | PRN
  Administered 2020-06-30: 200 mg via INTRAVENOUS

## 2020-06-30 MED ORDER — MIDAZOLAM HCL 2 MG/2ML IJ SOLN
INTRAMUSCULAR | Status: DC | PRN
  Administered 2020-06-30: 2 mg via INTRAVENOUS

## 2020-06-30 MED ORDER — OXYCODONE HCL 5 MG/5ML PO SOLN: 5.0000 mg | Freq: Once | ORAL | Status: DC | PRN

## 2020-06-30 MED ORDER — THROMBIN 20000 UNITS EX SOLR
CUTANEOUS | Status: DC | PRN
  Administered 2020-06-30: 20 mL via TOPICAL

## 2020-06-30 MED ORDER — METHYLPREDNISOLONE ACETATE 40 MG/ML IJ SUSP
INTRAMUSCULAR | Status: AC
  Filled 2020-06-30: qty 1

## 2020-06-30 MED ORDER — OXYCODONE-ACETAMINOPHEN 5-325 MG PO TABS
ORAL_TABLET | ORAL | Status: AC
  Filled 2020-06-30: qty 2

## 2020-06-30 MED ORDER — MENTHOL 3 MG MT LOZG: 1.0000 | LOZENGE | OROMUCOSAL | Status: DC | PRN

## 2020-06-30 MED ORDER — ROCURONIUM BROMIDE 10 MG/ML (PF) SYRINGE
PREFILLED_SYRINGE | INTRAVENOUS | Status: AC
  Filled 2020-06-30: qty 10

## 2020-06-30 MED ORDER — OXYCODONE HCL 5 MG PO TABS: 5.0000 mg | ORAL_TABLET | Freq: Once | ORAL | Status: DC | PRN

## 2020-06-30 MED ORDER — THROMBIN (RECOMBINANT) 20000 UNITS EX SOLR
CUTANEOUS | Status: AC
  Filled 2020-06-30: qty 20000

## 2020-06-30 MED ORDER — PHENOL 1.4 % MT LIQD: 1.0000 | OROMUCOSAL | Status: DC | PRN

## 2020-06-30 MED ORDER — ONDANSETRON HCL 4 MG/2ML IJ SOLN: 4.0000 mg | Freq: Once | INTRAMUSCULAR | Status: DC | PRN

## 2020-06-30 MED ORDER — DEXAMETHASONE SODIUM PHOSPHATE 10 MG/ML IJ SOLN
INTRAMUSCULAR | Status: DC | PRN
  Administered 2020-06-30: 10 mg via INTRAVENOUS

## 2020-06-30 MED ORDER — BUPIVACAINE-EPINEPHRINE (PF) 0.25% -1:200000 IJ SOLN
INTRAMUSCULAR | Status: AC
  Filled 2020-06-30: qty 10

## 2020-06-30 MED ORDER — LACTATED RINGERS IV SOLN: INTRAVENOUS | Status: DC

## 2020-06-30 MED ORDER — ACETAMINOPHEN 650 MG RE SUPP
650.0000 mg | RECTAL | Status: DC | PRN
Start: 2020-06-30 — End: 2020-06-30

## 2020-06-30 MED ORDER — OXYCODONE-ACETAMINOPHEN 10-325 MG PO TABS: 1.0000 | ORAL_TABLET | Freq: Four times a day (QID) | ORAL | 0 refills | Status: AC | PRN

## 2020-06-30 MED ORDER — SODIUM CHLORIDE 0.9% FLUSH
3.0000 mL | INTRAVENOUS | Status: DC | PRN
Start: 2020-06-30 — End: 2020-06-30

## 2020-06-30 MED ORDER — DEXAMETHASONE SODIUM PHOSPHATE 10 MG/ML IJ SOLN
INTRAMUSCULAR | Status: AC
  Filled 2020-06-30: qty 1

## 2020-06-30 MED ORDER — PHENYLEPHRINE 40 MCG/ML (10ML) SYRINGE FOR IV PUSH (FOR BLOOD PRESSURE SUPPORT)
PREFILLED_SYRINGE | INTRAVENOUS | Status: DC | PRN
  Administered 2020-06-30 (×3): 80 ug via INTRAVENOUS

## 2020-06-30 MED ORDER — FENTANYL CITRATE (PF) 100 MCG/2ML IJ SOLN: 25.0000 ug | INTRAMUSCULAR | Status: DC | PRN

## 2020-06-30 MED ORDER — LIDOCAINE 2% (20 MG/ML) 5 ML SYRINGE
INTRAMUSCULAR | Status: AC
  Filled 2020-06-30: qty 5

## 2020-06-30 MED ORDER — LACTATED RINGERS IV SOLN: INTRAVENOUS | Status: DC | PRN

## 2020-06-30 MED ORDER — ONDANSETRON HCL 4 MG PO TABS: 4.0000 mg | ORAL_TABLET | Freq: Three times a day (TID) | ORAL | 0 refills | Status: DC | PRN

## 2020-06-30 MED ORDER — ACETAMINOPHEN 10 MG/ML IV SOLN
INTRAVENOUS | Status: DC | PRN
  Administered 2020-06-30: 1000 mg via INTRAVENOUS

## 2020-06-30 SURGICAL SUPPLY — 64 items
AGENT HMST KT MTR STRL THRMB (HEMOSTASIS) ×1
BNDG GAUZE ELAST 4 BULKY (GAUZE/BANDAGES/DRESSINGS) ×3 IMPLANT
BUR EGG ELITE 4.0 (BURR) IMPLANT
BUR EGG ELITE 4.0MM (BURR)
BUR MATCHSTICK NEURO 3.0 LAGG (BURR) IMPLANT
CANISTER SUCT 3000ML PPV (MISCELLANEOUS) ×3 IMPLANT
CLOSURE STERI-STRIP 1/2X4 (GAUZE/BANDAGES/DRESSINGS) ×1
CLSR STERI-STRIP ANTIMIC 1/2X4 (GAUZE/BANDAGES/DRESSINGS) ×2 IMPLANT
CORD BIPOLAR FORCEPS 12FT (ELECTRODE) ×3 IMPLANT
COVER SURGICAL LIGHT HANDLE (MISCELLANEOUS) ×3 IMPLANT
COVER WAND RF STERILE (DRAPES) ×3 IMPLANT
DRAIN CHANNEL 15F RND FF W/TCR (WOUND CARE) IMPLANT
DRAPE C-ARM 35X43 STRL (DRAPES) ×2 IMPLANT
DRAPE C-ARMOR (DRAPES) ×2 IMPLANT
DRAPE POUCH INSTRU U-SHP 10X18 (DRAPES) ×3 IMPLANT
DRAPE SURG 17X23 STRL (DRAPES) ×3 IMPLANT
DRAPE U-SHAPE 47X51 STRL (DRAPES) ×3 IMPLANT
DRSG OPSITE 4X5.5 SM (GAUZE/BANDAGES/DRESSINGS) ×2 IMPLANT
DRSG OPSITE POSTOP 3X4 (GAUZE/BANDAGES/DRESSINGS) ×3 IMPLANT
DURAPREP 26ML APPLICATOR (WOUND CARE) ×3 IMPLANT
ELECT BLADE 4.0 EZ CLEAN MEGAD (MISCELLANEOUS)
ELECT CAUTERY BLADE 6.4 (BLADE) ×3 IMPLANT
ELECT PENCIL ROCKER SW 15FT (MISCELLANEOUS) ×3 IMPLANT
ELECT REM PT RETURN 9FT ADLT (ELECTROSURGICAL) ×3
ELECTRODE BLDE 4.0 EZ CLN MEGD (MISCELLANEOUS) IMPLANT
ELECTRODE REM PT RTRN 9FT ADLT (ELECTROSURGICAL) ×1 IMPLANT
EVACUATOR SILICONE 100CC (DRAIN) IMPLANT
GLOVE BIO SURGEON STRL SZ 6.5 (GLOVE) ×2 IMPLANT
GLOVE BIO SURGEONS STRL SZ 6.5 (GLOVE) ×1
GLOVE BIOGEL PI IND STRL 6.5 (GLOVE) ×1 IMPLANT
GLOVE BIOGEL PI IND STRL 8.5 (GLOVE) ×1 IMPLANT
GLOVE BIOGEL PI INDICATOR 6.5 (GLOVE) ×2
GLOVE BIOGEL PI INDICATOR 8.5 (GLOVE) ×2
GLOVE SS BIOGEL STRL SZ 8.5 (GLOVE) ×1 IMPLANT
GLOVE SUPERSENSE BIOGEL SZ 8.5 (GLOVE) ×2
GOWN STRL REUS W/ TWL LRG LVL3 (GOWN DISPOSABLE) ×2 IMPLANT
GOWN STRL REUS W/TWL 2XL LVL3 (GOWN DISPOSABLE) ×3 IMPLANT
GOWN STRL REUS W/TWL LRG LVL3 (GOWN DISPOSABLE) ×6
KIT BASIN OR (CUSTOM PROCEDURE TRAY) ×3 IMPLANT
KIT TURNOVER KIT B (KITS) ×3 IMPLANT
NDL SPNL 18GX3.5 QUINCKE PK (NEEDLE) ×2 IMPLANT
NEEDLE 22X1 1/2 (OR ONLY) (NEEDLE) ×3 IMPLANT
NEEDLE SPNL 18GX3.5 QUINCKE PK (NEEDLE) ×6 IMPLANT
NS IRRIG 1000ML POUR BTL (IV SOLUTION) ×3 IMPLANT
PACK LAMINECTOMY ORTHO (CUSTOM PROCEDURE TRAY) ×3 IMPLANT
PACK UNIVERSAL I (CUSTOM PROCEDURE TRAY) ×3 IMPLANT
PAD ARMBOARD 7.5X6 YLW CONV (MISCELLANEOUS) ×6 IMPLANT
PATTIES SURGICAL .5 X.5 (GAUZE/BANDAGES/DRESSINGS) ×3 IMPLANT
PATTIES SURGICAL .5 X1 (DISPOSABLE) ×3 IMPLANT
SPONGE SURGIFOAM ABS GEL 100 (HEMOSTASIS) IMPLANT
SURGIFLO W/THROMBIN 8M KIT (HEMOSTASIS) ×2 IMPLANT
SUT BONE WAX W31G (SUTURE) ×3 IMPLANT
SUT MNCRL AB 3-0 PS2 27 (SUTURE) ×3 IMPLANT
SUT VIC AB 0 CT1 27 (SUTURE)
SUT VIC AB 0 CT1 27XBRD ANBCTR (SUTURE) IMPLANT
SUT VIC AB 1 CT1 18XCR BRD 8 (SUTURE) ×1 IMPLANT
SUT VIC AB 1 CT1 8-18 (SUTURE) ×3
SUT VIC AB 2-0 CT1 18 (SUTURE) ×3 IMPLANT
SYR BULB IRRIG 60ML STRL (SYRINGE) ×3 IMPLANT
SYR CONTROL 10ML LL (SYRINGE) ×3 IMPLANT
TOWEL GREEN STERILE (TOWEL DISPOSABLE) ×3 IMPLANT
TOWEL GREEN STERILE FF (TOWEL DISPOSABLE) ×3 IMPLANT
WATER STERILE IRR 1000ML POUR (IV SOLUTION) ×3 IMPLANT
YANKAUER SUCT BULB TIP NO VENT (SUCTIONS) IMPLANT

## 2020-06-30 NOTE — Brief Op Note (Signed)
06/30/2020  1:05 PM  PATIENT:  Logan Hodge  54 y.o. male  PRE-OPERATIVE DIAGNOSIS:  L3-4 foraminal right herniated disc  POST-OPERATIVE DIAGNOSIS:  L3-4 foraminal right herniated disc  PROCEDURE:  Procedure(s): Right L3-4 foraminal discectomy (Right)  SURGEON:  Surgeon(s) and Role:    Melina Schools, MD - Primary  PHYSICIAN ASSISTANT:   ASSISTANTS: Amanda Ward, PA   ANESTHESIA:   general  EBL:  30 mL   BLOOD ADMINISTERED:none  DRAINS: none   LOCAL MEDICATIONS USED:  MARCAINE    and OTHER exparel and depomedrol  SPECIMEN:  No Specimen  DISPOSITION OF SPECIMEN:  N/A  COUNTS:  YES  TOURNIQUET:  * No tourniquets in log *  DICTATION: .Dragon Dictation  PLAN OF CARE: Discharge to home after PACU  PATIENT DISPOSITION:  PACU - hemodynamically stable.

## 2020-06-30 NOTE — Anesthesia Procedure Notes (Signed)
Procedure Name: Intubation Date/Time: 06/30/2020 11:33 AM Performed by: Michele Rockers, CRNA Pre-anesthesia Checklist: Patient identified, Emergency Drugs available, Suction available and Patient being monitored Patient Re-evaluated:Patient Re-evaluated prior to induction Oxygen Delivery Method: Circle system utilized Preoxygenation: Pre-oxygenation with 100% oxygen Induction Type: IV induction Ventilation: Mask ventilation without difficulty Laryngoscope Size: Miller and 3 Grade View: Grade I Tube type: Oral Tube size: 8.0 mm Number of attempts: 1 Airway Equipment and Method: Stylet and Oral airway Placement Confirmation: ETT inserted through vocal cords under direct vision,  positive ETCO2 and breath sounds checked- equal and bilateral Secured at: 22 cm Tube secured with: Tape Dental Injury: Teeth and Oropharynx as per pre-operative assessment

## 2020-06-30 NOTE — Op Note (Signed)
Operative report  Preoperative diagnosis: Right foraminal disc herniation L3-4 with right L3 nerve compression  Postoperative diagnosis: Same  Operative procedure: Right L3-4 foraminotomies and discectomy for decompression of right L3 nerve root.  First Assistant: Cleta Alberts, PA  Complications: None  EBL: Minimal  Indications: Logan Hodge is a very pleasant 54 year old gentleman who presents with severe onset of radicular L3 nerve pain.  Imaging studies confirm a large foraminal/extraforaminal disc herniation causing marked compression of the L3 nerve root.  As result of this pain and the size of the fragment we elected to move forward with surgery.  All appropriate risks benefits and alternatives were discussed with the patient and consent was obtained.  Operative procedure: Patient is brought the operating room placed upon operating table.  After successful induction of general anesthesia and endotracheal intubation teds SCDs were applied and he was turned prone onto the Wilson frame.  The back was prepped and draped in a standard fashion and a timeout was taken to confirm patient procedure and all other important data.  Using lateral fluoroscopy identified the L3 foramen and marked out my incision site 1 fingerbreadth to the right of midline.  I infiltrated the area with quarter percent Marcaine with epinephrine.  I then made my incision and dissected down to the deep fascia.  Once I encountered the deep fascia I then injected the paraspinal muscles with quarter percent Marcaine with epinephrine and Exparel for postoperative analgesia.  Once I had this area anesthetized I then bluntly dissected through the paraspinal muscle using the dilating tube down to the L3 pars.  I then sequentially dilated up and then placed my final retractor.  I placed a Penfield 4 into the L3 foramen and took an x-ray to confirm that I was at the appropriate level.  I then used a 3 mm Kerrison rongeur to resect the lateral  portion of the pars of L3.  I then used a fine nerve hook to dissect through the ligamentum flavum/facet capsule and develop a plane in order to resect this with my 2 mm Kerrison rongeur.  Once I remove this I could visualize the L3 nerve root.  I protected this with a neuro patty and then began dissecting over the disc space.  Large epidural veins were encountered and coagulated with bipolar electrocautery.  Once I had the nerve root protected and the posterior annulus identified and annulotomy was performed with a 15 blade scalpel.  I then used a nerve hook to circumferentially swing underneath the annulus and removed any loose fragments of disc material.  I then used a slightly larger nerve hook to sweep underneath the L3 nerve root itself and I delivered 2 very large fragments of disc material.  The amount of this material was consistent with what was seen on the preoperative MRI.  Once I remove these 2 large fragments of disc I was able to freely mobilize the L3 nerve root.  It was no longer under compression.  I could freely pass my Woodson elevator superiorly into the lateral recess above the L3 pedicle and down inferiorly just beyond the L4 pedicle.  I was also able to freely pass my [facility] along the path of the L3 nerve root into the extraforaminal region.  At this point time after thorough inspection and decompression I was pleased with the overall neural decompression.  I remove fragments of disc material consistent with preoperative imaging studies.  The wound was copiously irrigated with normal saline and obtained hemostasis with bipolar  cautery and FloSeal.  Final irrigation was done and then I placed 20 mg (1/2 cc) of Depo-Medrol over the L3 nerve root and then a thrombin-soaked Gelfoam patty over the foraminotomy site.  I then remove the Thompson retractor and then closed the deep fascia with interrupted #1 Vicryl suture.  A layer 2-0 Vicryl suture was used and then a 3-0 Monocryl for the  skin.  Steri-Strips and a dry dressing were applied and the patient was extubated and transferred to the PACU without incident.  The end of the case all needle sponge counts were correct.  There were no adverse intraoperative events.

## 2020-06-30 NOTE — H&P (Signed)
Addendum H&P  Patient continues to have significant back buttock and horrific right radicular leg pain.  Imaging studies were reviewed he has a large right foraminal mall/extraforaminal disc herniation causing compression to the L3 nerve root.  I have gone over the surgical procedure in great detail with the patient and he has expressed an understanding of the risks benefits and alternatives.  He is decided to proceed with surgery.  There is been no change to his clinical exam since his last office visit of 06/29/2020.

## 2020-06-30 NOTE — Discharge Instructions (Signed)
Laminectomy, Care After This sheet gives you information about how to care for yourself after your procedure. Your health care provider may also give you more specific instructions. If you have problems or questions, contact your health care provider. What can I expect after the procedure? After the procedure, it is common to have: Some pain around your incision area. Muscle tightening (spasms) across the back.   Follow these instructions at home: Incision care Follow instructions from your health care provider about how to take care of your incision area. Make sure you: Wash your hands with soap and water before and after you apply medicine to the area or change your bandage (dressing). If soap and water are not available, use hand sanitizer. Change your dressing as told by your health care provider. Leave stitches (sutures), skin glue, or adhesive strips in place. These skin closures may need to stay in place for 2 weeks or longer. If adhesive strip edges start to loosen and curl up, you may trim the loose edges. Do not remove adhesive strips completely unless your health care provider tells you to do that.  Check your incision area every [place] for signs of infection. Check for: More redness, swelling, or pain. More fluid or blood. Warmth. Pus or a bad smell. Medicines Take over-the-counter and prescription medicines only as told by your health care provider. If you were prescribed an antibiotic medicine, use it as told by your health care provider. Do not stop using the antibiotic even if you start to feel better. If needed, call office in 3 days to request refill of pain medications. Bathing Do not take baths, swim, or use a hot tub for 6 weeks, or until your incision has healed completely. If your health care provider approves, you may take showers after your dressing has been removed. Ok to shower in 5 days Activity Return to your normal activities as told by your health care  provider. Ask your health care provider what activities are safe for you. Avoid bending or twisting at your waist. Always bend at your knees. Do not sit for more than 20-30 minutes at a time. Lie down or walk between periods of sitting. Do not lift anything that is heavier than 10 lb (4.5 kg) or the limit that your health care provider tells you, until he or she says that it is safe. Do not drive for 2 weeks after your procedure or for as long as your health care provider tells you.  Do not drive or use heavy machinery while taking prescription pain medicine. General instructions To prevent or treat constipation while you are taking prescription pain medicine, your health care provider may recommend that you: Drink enough fluid to keep your urine clear or pale yellow. Take over-the-counter or prescription medicines. Eat foods that are high in fiber, such as fresh fruits and vegetables, whole grains, and beans. Limit foods that are high in fat and processed sugars, such as fried and sweet foods. Do breathing exercises as told. Keep all follow-up visits as told by your health care provider. This is important. Contact a health care provider if: You have more redness, swelling, or pain around your incision area. Your incision feels warm to the touch. You are not able to return to activities or do exercises as told by your health care provider. Get help right away if: You have: More fluid or blood coming from your incision area. Pus or a bad smell coming from your incision area. Chills or a fever.   Episodes of dizziness or fainting while standing. You develop a rash. You develop shortness of breath or you have difficulty breathing. You cannot control when you urinate or have a bowel movement. You become weak. You are not able to use your legs. Summary After the procedure, it is common to have some pain around your incision area. You may also have muscle tightening (spasms) across the  back. Follow instructions from your health care provider about how to care for your incision. Do not lift anything that is heavier than 10 lb (4.5 kg) or the limit that your health care provider tells you, until he or she says that it is safe. Contact your health care provider if you have more redness, swelling, or pain around your incision area or if your incision feels warm to the touch. These can be signs of infection. This information is not intended to replace advice given to you by your health care provider. Make sure you discuss any questions you have with your health care provider. Refer to this sheet in the next few weeks. These instructions provide you with information about caring for yourself after your procedure. Your health care provider may also give you more specific instructions. Your treatment has been planned according to current medical practices, but problems sometimes occur. Call your health care provider if you have any problems or questions after your procedure. What can I expect after the procedure? It is common to have pain for the first few days after the procedure. Some people continue to have mild pain even after making a full recovery. Follow these instructions at home: Medicine Take medicines only as directed by your health care provider. Avoid taking over-the-counter pain medicines unless your health care provider tells you otherwise. These medicines interfere with the development and growth of new bone cells. If you were prescribed a narcotic pain medicine, take it exactly as told by your health care provider. Do not drink alcohol while on the medicine. Do not drive while on the medicine. Injury care Care for your back brace as told by your health care provider. If directed, apply ice to the injured area: Put ice in a plastic bag. Place a towel between your skin and the bag. Leave the ice on for 20 minutes, 2-3 times a [place]. Activity Perform physical therapy  exercises as told by your health care provider. Exercise regularly. Start by taking short walks. Slowly increase your activity level over time. Gentle exercise helps to ease pain. Sit, stand, walk, turn in bed, and reposition yourself as told by your health care provider. This will help to keep your spine in proper alignment. Avoid bending and twisting your body. Avoid doing strenuous household chores, such as vacuuming. Do not lift anything that is heavier than 10 lb (4.5 kg). Other Instructions Keep all follow-up visits as directed by your health care provider. This is important. Do not use any tobacco products, including cigarettes, chewing tobacco, or electronic cigarettes. If you need help quitting, ask your health care provider. Nicotine affects the way bones heal. Contact a health care provider if: Your pain gets worse. You have a fever. You have redness, swelling, or pain at the site of your incision. You have fluid, blood, or pus coming from your incision. You have numbness, tingling, or weakness in any part of your body. Get help right away if: Your incision feels swollen and tender, and the surrounding area looks like a lump. The lump may be red or bluish in color. You   cannot move any part of your body (paralysis). You cannot control your bladder or bowels. 

## 2020-06-30 NOTE — Transfer of Care (Signed)
Immediate Anesthesia Transfer of Care Note  Patient: Logan Hodge  Procedure(s) Performed: Right L3-4 foraminal discectomy (Right )  Patient Location: PACU  Anesthesia Type:General  Level of Consciousness: drowsy, patient cooperative and responds to stimulation  Airway & Oxygen Therapy: Patient Spontanous Breathing and Patient connected to face mask oxygen  Post-op Assessment: Report given to RN, Post -op Vital signs reviewed and stable and Patient moving all extremities X 4  Post vital signs: Reviewed and stable  Last Vitals:  Vitals Value Taken Time  BP    Temp    Pulse    Resp    SpO2      Last Pain:  Vitals:   06/30/20 0937  TempSrc: Oral  PainSc:       Patients Stated Pain Goal: 4 (37/00/52 5910)  Complications: No complications documented.

## 2020-07-01 ENCOUNTER — Encounter (HOSPITAL_COMMUNITY): Payer: Self-pay | Admitting: Orthopedic Surgery

## 2020-07-01 MED FILL — Thrombin (Recombinant) For Soln 20000 Unit: CUTANEOUS | Qty: 1 | Status: AC

## 2020-07-01 NOTE — Anesthesia Postprocedure Evaluation (Signed)
Anesthesia Post Note  Patient: Larwence Janik  Procedure(s) Performed: Right L3-4 foraminal discectomy (Right )     Patient location during evaluation: PACU Anesthesia Type: General Level of consciousness: awake and alert Pain management: pain level controlled Vital Signs Assessment: post-procedure vital signs reviewed and stable Respiratory status: spontaneous breathing, nonlabored ventilation, respiratory function stable and patient connected to nasal cannula oxygen Cardiovascular status: blood pressure returned to baseline and stable Postop Assessment: no apparent nausea or vomiting Anesthetic complications: no   No complications documented.  Last Vitals:  Vitals:   06/30/20 1413 06/30/20 1430  BP: (!) 138/83 (!) 136/89  Pulse: 84 90  Resp: 12 13  Temp:  36.7 C  SpO2: 96% 95%    Last Pain:  Vitals:   06/30/20 1430  TempSrc:   PainSc: 2    Pain Goal: Patients Stated Pain Goal: 4 (06/30/20 0936)                 Jodey Burbano

## 2020-07-20 DIAGNOSIS — D225 Melanocytic nevi of trunk: Secondary | ICD-10-CM | POA: Diagnosis not present

## 2020-07-20 DIAGNOSIS — D1801 Hemangioma of skin and subcutaneous tissue: Secondary | ICD-10-CM | POA: Diagnosis not present

## 2020-07-20 DIAGNOSIS — D2371 Other benign neoplasm of skin of right lower limb, including hip: Secondary | ICD-10-CM | POA: Diagnosis not present

## 2020-07-20 DIAGNOSIS — L80 Vitiligo: Secondary | ICD-10-CM | POA: Diagnosis not present

## 2020-08-02 ENCOUNTER — Other Ambulatory Visit: Payer: Self-pay

## 2020-08-02 ENCOUNTER — Ambulatory Visit (INDEPENDENT_AMBULATORY_CARE_PROVIDER_SITE_OTHER): Payer: BC Managed Care – PPO | Admitting: Physician Assistant

## 2020-08-02 ENCOUNTER — Encounter: Payer: Self-pay | Admitting: Physician Assistant

## 2020-08-02 VITALS — BP 122/86 | HR 82 | Temp 98.1°F | Resp 16 | Ht 72.5 in | Wt 210.0 lb

## 2020-08-02 DIAGNOSIS — E785 Hyperlipidemia, unspecified: Secondary | ICD-10-CM | POA: Diagnosis not present

## 2020-08-02 DIAGNOSIS — Z125 Encounter for screening for malignant neoplasm of prostate: Secondary | ICD-10-CM | POA: Diagnosis not present

## 2020-08-02 DIAGNOSIS — I10 Essential (primary) hypertension: Secondary | ICD-10-CM

## 2020-08-02 DIAGNOSIS — Z0000 Encounter for general adult medical examination without abnormal findings: Secondary | ICD-10-CM | POA: Diagnosis not present

## 2020-08-02 DIAGNOSIS — Z1211 Encounter for screening for malignant neoplasm of colon: Secondary | ICD-10-CM

## 2020-08-02 LAB — COMPREHENSIVE METABOLIC PANEL
ALT: 29 U/L (ref 0–53)
AST: 18 U/L (ref 0–37)
Albumin: 4.5 g/dL (ref 3.5–5.2)
Alkaline Phosphatase: 63 U/L (ref 39–117)
BUN: 14 mg/dL (ref 6–23)
CO2: 29 mEq/L (ref 19–32)
Calcium: 9.5 mg/dL (ref 8.4–10.5)
Chloride: 102 mEq/L (ref 96–112)
Creatinine, Ser: 0.83 mg/dL (ref 0.40–1.50)
GFR: 95.97 mL/min (ref >60.00)
Glucose, Bld: 90 mg/dL (ref 70–99)
Potassium: 4.1 mEq/L (ref 3.5–5.1)
Sodium: 140 mEq/L (ref 135–145)
Total Bilirubin: 1.1 mg/dL (ref 0.2–1.2)
Total Protein: 6.9 g/dL (ref 6.0–8.3)

## 2020-08-02 LAB — CBC WITH DIFFERENTIAL/PLATELET
Basophils Absolute: 0.1 10*3/uL (ref 0.0–0.1)
Basophils Relative: 1.3 % (ref 0.0–3.0)
Eosinophils Absolute: 0.3 10*3/uL (ref 0.0–0.7)
Eosinophils Relative: 6.1 % — ABNORMAL HIGH (ref 0.0–5.0)
HCT: 45.9 % (ref 39.0–52.0)
Hemoglobin: 15.7 g/dL (ref 13.0–17.0)
Lymphocytes Relative: 22.1 % (ref 12.0–46.0)
Lymphs Abs: 1.2 10*3/uL (ref 0.7–4.0)
MCHC: 34.2 g/dL (ref 30.0–36.0)
MCV: 88.4 fl (ref 78.0–100.0)
Monocytes Absolute: 0.4 10*3/uL (ref 0.1–1.0)
Monocytes Relative: 7.3 % (ref 3.0–12.0)
Neutro Abs: 3.3 10*3/uL (ref 1.4–7.7)
Neutrophils Relative %: 63.2 % (ref 43.0–77.0)
Platelets: 242 10*3/uL (ref 150.0–400.0)
RBC: 5.19 Mil/uL (ref 4.22–5.81)
RDW: 13.5 % (ref 11.5–15.5)
WBC: 5.2 10*3/uL (ref 4.0–10.5)

## 2020-08-02 LAB — PSA: PSA: 0.54 ng/mL (ref 0.10–4.00)

## 2020-08-02 LAB — LIPID PANEL
Cholesterol: 165 mg/dL (ref 0–200)
HDL: 50.2 mg/dL (ref >39.00)
LDL Cholesterol: 86 mg/dL (ref 0–99)
NonHDL: 114.38
Total CHOL/HDL Ratio: 3
Triglycerides: 140 mg/dL (ref 0.0–149.0)
VLDL: 28 mg/dL (ref 0.0–40.0)

## 2020-08-02 LAB — HEMOGLOBIN A1C: Hgb A1c MFr Bld: 5.3 % (ref 4.6–6.5)

## 2020-08-02 MED ORDER — LOSARTAN POTASSIUM 100 MG PO TABS: 100.0000 mg | ORAL_TABLET | Freq: Every day | ORAL | 3 refills | Status: DC

## 2020-08-02 NOTE — Progress Notes (Signed)
Patient presents to clinic today to establish care. Is due for yearly physical and would like today if time allows. Endorses fasting for labs.   Acute Concerns: Denies new concerns today.   Chronic Issues: Hypertension -- Patient currently on a regimen of losartan 50 mg and HCTZ 12.5 mg daily. Endorses taking medications as directed and tolerating well without noted side effect. 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 Patient denies chest pain, palpitations, lightheadedness, dizziness, vision changes or frequent headaches.   BP Readings from Last 3 Encounters:  08/02/20 140/90  06/30/20 (!) 136/89  06/28/20 (!) 150/102   Hyperlipidemia -- Patient is currently on a regimen of Atorvastatin 10 mg, taking as directed. Endorses diet is well-balanced overall. Is keeping active up until recent back surgery.   Health Maintenance: Immunizations -- TDaP UTD.  Colonoscopy -- Last in 2016. Due for repeat due to polyps.   HepC/HIV Screen -- Negative. Hep C screen previously. Negative.   Past Medical History:  Diagnosis Date  . Allergic rhinitis   . ED (erectile dysfunction)   . GERD (gastroesophageal reflux disease)   . HLD (hyperlipidemia)   . HTN (hypertension)   . Lumbar radiculopathy    lumbar foraminal disc herniation  . Melanoma ([facility] [facility])    hx  . Pneumonia     Past Surgical History:  Procedure Laterality Date  . COLONOSCOPY N/A 02/25/2015   Procedure: COLONOSCOPY;  Surgeon: Danie Binder, MD;  [facility];  Service: Endoscopy;  Laterality: N/A;  915am - moved to 3/24 same time - [place] notified pt  . ESOPHAGOGASTRODUODENOSCOPY N/A 02/25/2015   Procedure: ESOPHAGOGASTRODUODENOSCOPY (EGD);  Surgeon: Danie Binder, MD;  [facility];  Service: Endoscopy;  Laterality: N/A;  . ESOPHAGOGASTRODUODENOSCOPY N/A 08/20/2015   Procedure: ESOPHAGOGASTRODUODENOSCOPY (EGD);  Surgeon: Danie Binder, MD;  [facility];  Service:  Endoscopy;  Laterality: N/A;  315 - moved to 9/16 @ 9:30  . HERNIA REPAIR  1997  . LASIK  2007  . LIPOMA EXCISION  2005   L arm  . LUMBAR LAMINECTOMY/DECOMPRESSION MICRODISCECTOMY Right 06/30/2020   Procedure: Right L3-4 foraminal discectomy;  Surgeon: Melina Schools, MD;  Location: [facility];  Service: Orthopedics;  Laterality: Right;  . MELANOMA EXCISION  2004   right shoulder   . WISDOM TOOTH EXTRACTION      Current Outpatient Medications on File Prior to Visit  Medication Sig Dispense Refill  . atorvastatin (LIPITOR) 10 MG tablet Take 10 mg by mouth daily with lunch.     . hydrochlorothiazide (MICROZIDE) 12.5 MG capsule Take 12.5 mg by mouth daily with lunch.     . losartan (COZAAR) 50 MG tablet Take 50 mg by mouth daily with lunch.     . vitamin B-12 (CYANOCOBALAMIN) 1000 MCG tablet Take 1,000 mcg by mouth daily.     No current facility-administered medications on file prior to visit.    Allergies  Allergen Reactions  . Avelox [Moxifloxacin Hcl In Nacl] Hives, Itching and Rash    Family History  Problem Relation Age of Onset  . Colon polyps Brother   . Non-Hodgkin's lymphoma Father   . Colon cancer Neg Hx   . Stomach cancer Neg Hx     Social History   Socioeconomic History  . Marital status: Married    Spouse name: Not on file  . Number of children: Not on file  . Years of education: Not on file  . Highest education level:  Not on file  Occupational History  . Not on file  Tobacco Use  . Smoking status: Never Smoker  . Smokeless tobacco: Never Used  Vaping Use  . Vaping Use: Never used  Substance and Sexual Activity  . Alcohol use: No  . Drug use: No  . Sexual activity: Yes  Other Topics Concern  . Not on file  Social History Narrative   Married, lives with wife, does not get regular exercise.    Social Determinants of Health   Financial Resource Strain:   . Difficulty of Paying Living Expenses: Not on file  Food Insecurity:   . Worried About Ship broker in the Last Year: Not on file  . Ran Out of Food in the Last Year: Not on file  Transportation Needs:   . Lack of Transportation (Medical): Not on file  . Lack of Transportation (Non-Medical): Not on file  Physical Activity:   . Days of Exercise per Week: Not on file  . Minutes of Exercise per Session: Not on file  Stress:   . Feeling of Stress : Not on file  Social Connections:   . Frequency of Communication with Friends and Family: Not on file  . Frequency of Social Gatherings with Friends and Family: Not on file  . Attends Religious Services: Not on file  . Active Member of Clubs or Organizations: Not on file  . Attends Archivist Meetings: Not on file  . Marital Status: Not on file  Intimate Partner Violence:   . Fear of Current or Ex-Partner: Not on file  . Emotionally Abused: Not on file  . Physically Abused: Not on file  . Sexually Abused: Not on file   Review of Systems  Constitutional: Negative for fever and weight loss.  HENT: Negative for ear discharge, ear pain, hearing loss and tinnitus.   Eyes: Negative for blurred vision, double vision, photophobia and pain.  Respiratory: Negative for cough and shortness of breath.   Cardiovascular: Negative for chest pain and palpitations.  Gastrointestinal: Negative for abdominal pain, blood in stool, constipation, diarrhea, heartburn, melena, nausea and vomiting.  Genitourinary: Negative for dysuria, flank pain, frequency, hematuria and urgency.       Nocturia x 0-1  Musculoskeletal: Negative for falls.  Neurological: Negative for dizziness, loss of consciousness and headaches.  Endo/Heme/Allergies: Negative for environmental allergies.  Psychiatric/Behavioral: Negative for depression, hallucinations, substance abuse and suicidal ideas. The patient is not nervous/anxious and does not have insomnia.     BP 140/90   Pulse 82   Temp 98.1 F (36.7 C) (Temporal)   Resp 16   Ht 6' 0.5" (1.842 m)   Wt 210 lb  (95.3 kg)   SpO2 99%   BMI 28.09 kg/m   Physical Exam Vitals reviewed.  Constitutional:      General: He is not in acute distress.    Appearance: He is well-developed. He is not diaphoretic.  HENT:     Head: Normocephalic and atraumatic.     Right Ear: Tympanic membrane, ear canal and external ear normal.     Left Ear: Tympanic membrane, ear canal and external ear normal.     Nose: Nose normal.     Mouth/Throat:     Pharynx: No posterior oropharyngeal erythema.  Eyes:     Conjunctiva/sclera: Conjunctivae normal.     Pupils: Pupils are equal, round, and reactive to light.  Neck:     Thyroid: No thyromegaly.  Cardiovascular:  Rate and Rhythm: Normal rate and regular rhythm.     Heart sounds: Normal heart sounds.  Pulmonary:     Effort: Pulmonary effort is normal. No respiratory distress.     Breath sounds: Normal breath sounds. No wheezing or rales.  Chest:     Chest wall: No tenderness.  Abdominal:     General: Bowel sounds are normal. There is no distension.     Palpations: Abdomen is soft. There is no mass.     Tenderness: There is no abdominal tenderness. There is no guarding or rebound.  Musculoskeletal:     Cervical back: Neck supple.  Lymphadenopathy:     Cervical: No cervical adenopathy.  Skin:    General: Skin is warm and dry.     Findings: No rash.  Neurological:     Mental Status: He is alert and oriented to person, place, and time.     Cranial Nerves: No cranial nerve deficit.     Recent Results (from the past 2160 hour(s))  Surgical pcr screen     Status: None   Collection Time: 06/28/20 10:59 AM   Specimen: Nasal Mucosa; Nasal Swab  Result Value Ref Range   MRSA, PCR NEGATIVE NEGATIVE   Staphylococcus aureus NEGATIVE NEGATIVE    Comment: (NOTE) The Xpert SA Assay (FDA approved for NASAL specimens in patients 56 years of age and older), is one component of a comprehensive surveillance program. It is not intended to diagnose infection nor  to guide or monitor treatment. Performed at [facility], [place] [address], [place], [place] [address]   APTT     Status: None   Collection Time: 06/28/20 11:00 AM  Result Value Ref Range   aPTT 24 24 - 36 seconds    Comment: Performed at [facility] [address], [place], [place] [address]  Basic metabolic panel     Status: None   Collection Time: 06/28/20 11:00 AM  Result Value Ref Range   Sodium 137 135 - 145 mmol/L   Potassium 3.9 3.5 - 5.1 mmol/L   Chloride 101 98 - 111 mmol/L   CO2 28 22 - 32 mmol/L   Glucose, Bld 97 70 - 99 mg/dL    Comment: Glucose reference range applies only to samples taken after fasting for at least 8 hours.   BUN 15 6 - 20 mg/dL   Creatinine, Ser 0.88 0.61 - 1.24 mg/dL   Calcium 9.5 8.9 - 10.3 mg/dL   GFR calc non Af Amer >60 >60 mL/min   GFR calc Af Amer >60 >60 mL/min   Anion gap 8 5 - 15    Comment: Performed at [facility] [address], [place] [address]  CBC     Status: None   Collection Time: 06/28/20 11:00 AM  Result Value Ref Range   WBC 6.6 4.0 - 10.5 K/uL   RBC 5.73 4.22 - 5.81 MIL/uL   Hemoglobin 16.7 13.0 - 17.0 g/dL   HCT 50.6 39 - 52 %   MCV 88.3 80.0 - 100.0 fL   MCH 29.1 26.0 - 34.0 pg   MCHC 33.0 30.0 - 36.0 g/dL   RDW 11.9 11.5 - 15.5 %   Platelets 242 150 - 400 K/uL   nRBC 0.0 0.0 - 0.2 %    Comment: Performed at [facility], [place] [address] [place], [place] [address]  Protime-INR     Status: None   Collection Time: 06/28/20 11:00  AM  Result Value Ref Range   Prothrombin Time 12.4 11.4 - 15.2 seconds   INR 1.0 0.8 - 1.2    Comment: (NOTE) INR goal varies based on device and disease states. Performed at [facility], [place] [address] [place], [place] [address]   Urinalysis, Routine w reflex microscopic     Status: Abnormal   Collection Time: 06/28/20 11:00 AM  Result Value Ref Range   Color, Urine YELLOW YELLOW   APPearance CLEAR CLEAR   Specific Gravity, Urine 1.025  1.005 - 1.030   pH 5.0 5.0 - 8.0   Glucose, UA NEGATIVE NEGATIVE mg/dL   Hgb urine dipstick MODERATE (A) NEGATIVE   Bilirubin Urine NEGATIVE NEGATIVE   Ketones, ur NEGATIVE NEGATIVE mg/dL   Protein, ur NEGATIVE NEGATIVE mg/dL   Nitrite NEGATIVE NEGATIVE   Leukocytes,Ua NEGATIVE NEGATIVE   RBC / HPF 0-5 0 - 5 RBC/hpf   WBC, UA 0-5 0 - 5 WBC/hpf   Bacteria, UA NONE SEEN NONE SEEN   Squamous Epithelial / LPF 0-5 0 - 5   Mucus PRESENT     Comment: Performed at [facility], [place] [address] [place], [place] [address]  SARS CORONAVIRUS 2 (TAT 6-24 HRS) Nasopharyngeal Nasopharyngeal Swab     Status: None   Collection Time: 06/28/20 12:06 PM   Specimen: Nasopharyngeal Swab  Result Value Ref Range   SARS Coronavirus 2 NEGATIVE NEGATIVE    Comment: (NOTE) SARS-CoV-2 target nucleic acids are NOT DETECTED.  The SARS-CoV-2 RNA is generally detectable in upper and lower respiratory specimens during the acute phase of infection. Negative results do not preclude SARS-CoV-2 infection, do not rule out co-infections with other pathogens, and should not be used as the sole basis for treatment or other patient management decisions. Negative results must be combined with clinical observations, patient history, and epidemiological information. The expected result is Negative.  Fact Sheet for Patients: SugarRoll.be  Fact Sheet for Healthcare Providers: https://www.woods-mathews.com/  This test is not yet approved or cleared by the [place] FDA and  has been authorized for detection and/or diagnosis of SARS-CoV-2 by FDA under an Emergency Use Authorization (EUA). This EUA will remain  in effect (meaning this test can be used) for the duration of the COVID-19 declaration under Se ction 564(b)(1) of the Act, 21 U.S.C. section 360bbb-3(b)(1), unless the authorization is terminated or revoked sooner.  Performed at [facility], [place]  [address] [place], [place] [address]    Assessment/Plan: 1. Visit for preventive health examination Depression screen negative. Health Maintenance reviewed. Preventive schedule discussed and handout given in AVS. Will obtain fasting labs today.  - CBC with Differential/Platelet  2. Essential hypertension BP above goal. Improved on recheck but has had multiple elevated readings to report. Will continue HCTZ at current dose. Increase losartan to 100 mg daily. DASH diet reviewed. Handout given. Follow-up in 3 weeks.  - losartan (COZAAR) 100 MG tablet; Take 1 tablet (100 mg total) by mouth daily.  Dispense: 90 tablet; Refill: 3 - Comprehensive metabolic panel  3. Hyperlipidemia, unspecified hyperlipidemia type Taking statin as directed. Dietary and exercise recommendations reviewed - Hemoglobin A1c - Lipid panel  4. Prostate cancer screening He indicates understanding of the limitations of this screening test and wishes to proceed with screening PSA testing.  - PSA  5. Colon cancer screening Due giving history of colon polyps. Referral placed back to GI to update screening colonoscopy.  - Ambulatory referral to Gastroenterology  This visit occurred during the SARS-CoV-2 public health emergency.  Safety protocols were in place, including screening questions prior to the visit, additional usage of staff PPE, and extensive cleaning of exam room while observing appropriate contact time as indicated for disinfecting solutions.    Leeanne Rio, PA-C

## 2020-08-02 NOTE — Patient Instructions (Signed)
Please go to the lab for blood work.   Our office will call you with your results unless you have chosen to receive results via MyChart.  If your blood work is normal we will follow-up each year for physicals and as scheduled for chronic medical problems.  If anything is abnormal we will treat accordingly and get you in for a follow-up.  Please start the new dose of losartan (100 mg) daily giving recent elevation in BP. Keep diet low in salt (see below). Follow-up in 3 weeks for reassessment.    DASH Eating Plan DASH stands for "Dietary Approaches to Stop Hypertension." The DASH eating plan is a healthy eating plan that has been shown to reduce high blood pressure (hypertension). It may also reduce your risk for type 2 diabetes, heart disease, and stroke. The DASH eating plan may also help with weight loss. What are tips for following this plan?  General guidelines  Avoid eating more than 2,300 mg (milligrams) of salt (sodium) a Ringel. If you have hypertension, you may need to reduce your sodium intake to 1,500 mg a Yamada.  Limit alcohol intake to no more than 1 drink a Pina for nonpregnant women and 2 drinks a Medley for men. One drink equals 12 oz of beer, 5 oz of wine, or 1 oz of hard liquor.  Work with your health care provider to maintain a healthy body weight or to lose weight. Ask what an ideal weight is for you.  Get at least 30 minutes of exercise that causes your heart to beat faster (aerobic exercise) most days of the week. Activities may include walking, swimming, or biking.  Work with your health care provider or diet and nutrition specialist (dietitian) to adjust your eating plan to your individual calorie needs. Reading food labels   Check food labels for the amount of sodium per serving. Choose foods with less than 5 percent of the Daily Value of sodium. Generally, foods with less than 300 mg of sodium per serving fit into this eating plan.  To find whole grains, look for the  word "whole" as the first word in the ingredient list. Shopping  Buy products labeled as "low-sodium" or "no salt added."  Buy fresh foods. Avoid canned foods and premade or frozen meals. Cooking  Avoid adding salt when cooking. Use salt-free seasonings or herbs instead of table salt or sea salt. Check with your health care provider or pharmacist before using salt substitutes.  Do not fry foods. Cook foods using healthy methods such as baking, boiling, grilling, and broiling instead.  Cook with heart-healthy oils, such as olive, canola, soybean, or sunflower oil. Meal planning  Eat a balanced diet that includes: ? 5 or more servings of fruits and vegetables each F[place]ell. At each meal, try to fill half of your plate with fruits and vegetables. ? Up to 6-8 servings of whole grains each Dessert. ? Less than 6 oz of lean meat, poultry, or fish each [place]. A 3-oz serving of meat is about the same size as a deck of cards. One egg equals 1 oz. ? 2 servings of low-fat dairy each [place]. ? A serving of nuts, seeds, or beans 5 times each week. ? Heart-healthy fats. Healthy fats called Omega-3 fatty acids are found in foods such as flaxseeds and coldwater fish, like sardines, salmon, and mackerel.  Limit how much you eat of the following: ? Canned or prepackaged foods. ? Food that is high in trans fat, such as fried  foods. ? Food that is high in saturated fat, such as fatty meat. ? Sweets, desserts, sugary drinks, and other foods with added sugar. ? Full-fat dairy products.  Do not salt foods before eating.  Try to eat at least 2 vegetarian meals each week.  Eat more home-cooked food and less restaurant, buffet, and fast food.  When eating at a restaurant, ask that your food be prepared with less salt or no salt, if possible. What foods are recommended? The items listed may not be a complete list. Talk with your dietitian about what dietary choices are best for you. Grains Whole-grain or whole-wheat  bread. Whole-grain or whole-wheat pasta. Brown rice. [place] Morrow. Bulgur. Whole-grain and low-sodium cereals. Pita bread. Low-fat, low-sodium crackers. Whole-wheat flour tortillas. Vegetables Fresh or frozen vegetables (raw, steamed, roasted, or grilled). Low-sodium or reduced-sodium tomato and vegetable juice. Low-sodium or reduced-sodium tomato sauce and tomato paste. Low-sodium or reduced-sodium canned vegetables. Fruits All fresh, dried, or frozen fruit. Canned fruit in natural juice (without added sugar). Meat and other protein foods Skinless chicken or [place]. Ground chicken or [place]. Pork with fat trimmed off. Fish and seafood. Egg whites. Dried beans, peas, or lentils. Unsalted nuts, nut butters, and seeds. Unsalted canned beans. Lean cuts of beef with fat trimmed off. Low-sodium, lean deli meat. Dairy Low-fat (1%) or fat-free (skim) milk. Fat-free, low-fat, or reduced-fat cheeses. Nonfat, low-sodium ricotta or cottage cheese. Low-fat or nonfat yogurt. Low-fat, low-sodium cheese. Fats and oils Soft margarine without trans fats. Vegetable oil. Low-fat, reduced-fat, or light mayonnaise and salad dressings (reduced-sodium). Canola, safflower, olive, soybean, and sunflower oils. Avocado. Seasoning and other foods Herbs. Spices. Seasoning mixes without salt. Unsalted popcorn and pretzels. Fat-free sweets. What foods are not recommended? The items listed may not be a complete list. Talk with your dietitian about what dietary choices are best for you. Grains Baked goods made with fat, such as croissants, muffins, or some breads. Dry pasta or rice meal packs. Vegetables Creamed or fried vegetables. Vegetables in a cheese sauce. Regular canned vegetables (not low-sodium or reduced-sodium). Regular canned tomato sauce and paste (not low-sodium or reduced-sodium). Regular tomato and vegetable juice (not low-sodium or reduced-sodium). Angie Fava. Olives. Fruits Canned fruit in a light or heavy  syrup. Fried fruit. Fruit in cream or butter sauce. Meat and other protein foods Fatty cuts of meat. Ribs. Fried meat. Berniece Salines. Sausage. Bologna and other processed lunch meats. Salami. Fatback. Hotdogs. Bratwurst. Salted nuts and seeds. Canned beans with added salt. Canned or smoked fish. Whole eggs or egg yolks. Chicken or [place] with skin. Dairy Whole or 2% milk, cream, and half-and-half. Whole or full-fat cream cheese. Whole-fat or sweetened yogurt. Full-fat cheese. Nondairy creamers. Whipped toppings. Processed cheese and cheese spreads. Fats and oils Butter. Stick margarine. Lard. Shortening. Ghee. Bacon fat. Tropical oils, such as coconut, palm kernel, or palm oil. Seasoning and other foods Salted popcorn and pretzels. Onion salt, garlic salt, seasoned salt, table salt, and sea salt. Worcestershire sauce. Tartar sauce. Barbecue sauce. Teriyaki sauce. Soy sauce, including reduced-sodium. Steak sauce. Canned and packaged gravies. Fish sauce. Oyster sauce. Cocktail sauce. Horseradish that you find on the shelf. Ketchup. Mustard. Meat flavorings and tenderizers. Bouillon cubes. Hot sauce and Tabasco sauce. Premade or packaged marinades. Premade or packaged taco seasonings. Relishes. Regular salad dressings. Where to find more information:  National Heart, Lung, and [place]: https://wilson-eaton.com/  American Heart Association: www.heart.org Summary  The DASH eating plan is a healthy eating plan that has been shown to reduce  high blood pressure (hypertension). It may also reduce your risk for type 2 diabetes, heart disease, and stroke.  With the DASH eating plan, you should limit salt (sodium) intake to 2,300 mg a Ciszek. If you have hypertension, you may need to reduce your sodium intake to 1,500 mg a Quintanar.  When on the DASH eating plan, aim to eat more fresh fruits and vegetables, whole grains, lean proteins, low-fat dairy, and heart-healthy fats.  Work with your health care provider or diet and  nutrition specialist (dietitian) to adjust your eating plan to your individual calorie needs. This information is not intended to replace advice given to you by your health care provider. Make sure you discuss any questions you have with your health care provider. Document Revised: 11/02/2017 Document Reviewed: 11/13/2016 Elsevier Patient Education  [employer]. .   Preventive Care 80-37 Years Old, Male Preventive care refers to lifestyle choices and visits with your health care provider that can promote health and wellness. This includes:  A yearly physical exam. This is also called an annual well check.  Regular dental and eye exams.  Immunizations.  Screening for certain conditions.  Healthy lifestyle choices, such as eating a healthy diet, getting regular exercise, not using drugs or products that contain nicotine and tobacco, and limiting alcohol use. What can I expect for my preventive care visit? Physical exam Your health care provider will check:  Height and weight. These may be used to calculate body mass index (BMI), which is a measurement that tells if you are at a healthy weight.  Heart rate and blood pressure.  Your skin for abnormal spots. Counseling Your health care provider may ask you questions about:  Alcohol, tobacco, and drug use.  Emotional well-being.  Home and relationship well-being.  Sexual activity.  Eating habits.  Work and work Statistician. What immunizations do I need?  Influenza (flu) vaccine  This is recommended every year. Tetanus, diphtheria, and pertussis (Tdap) vaccine  You may need a Td booster every 10 years. Varicella (chickenpox) vaccine  You may need this vaccine if you have not already been vaccinated. Zoster (shingles) vaccine  You may need this after age 34. Measles, mumps, and rubella (MMR) vaccine  You may need at least one dose of MMR if you were born in 1957 or later. You may also need a second  dose. Pneumococcal conjugate (PCV13) vaccine  You may need this if you have certain conditions and were not previously vaccinated. Pneumococcal polysaccharide (PPSV23) vaccine  You may need one or two doses if you smoke cigarettes or if you have certain conditions. Meningococcal conjugate (MenACWY) vaccine  You may need this if you have certain conditions. Hepatitis A vaccine  You may need this if you have certain conditions or if you travel or work in places where you may be exposed to hepatitis A. Hepatitis B vaccine  You may need this if you have certain conditions or if you travel or work in places where you may be exposed to hepatitis B. Haemophilus influenzae type b (Hib) vaccine  You may need this if you have certain risk factors. Human papillomavirus (HPV) vaccine  If recommended by your health care provider, you may need three doses over 6 months. You may receive vaccines as individual doses or as more than one vaccine together in one shot (combination vaccines). Talk with your health care provider about the risks and benefits of combination vaccines. What tests do I need? Blood tests  Lipid and cholesterol  levels. These may be checked every 5 years, or more frequently if you are over 62 years old.  Hepatitis C test.  Hepatitis B test. Screening  Lung cancer screening. You may have this screening every year starting at age 18 if you have a 30-pack-year history of smoking and currently smoke or have quit within the past 15 years.  Prostate cancer screening. Recommendations will vary depending on your family history and other risks.  Colorectal cancer screening. All adults should have this screening starting at age 19 and continuing until age 110. Your health care provider may recommend screening at age 42 if you are at increased risk. You will have tests every 1-10 years, depending on your results and the type of screening test.  Diabetes screening. This is done by  checking your blood sugar (glucose) after you have not eaten for a while (fasting). You may have this done every 1-3 years.  Sexually transmitted disease (STD) testing. Follow these instructions at home: Eating and drinking  Eat a diet that includes fresh fruits and vegetables, whole grains, lean protein, and low-fat dairy products.  Take vitamin and mineral supplements as recommended by your health care provider.  Do not drink alcohol if your health care provider tells you not to drink.  If you drink alcohol: ? Limit how much you have to 0-2 drinks a Wisz. ? Be aware of how much alcohol is in your drink. In the [place] one drink equals one 12 oz bottle of beer (355 mL), one 5 oz glass of wine (148 mL), or one 1 oz glass of hard liquor (44 mL). Lifestyle  Take daily care of your teeth and gums.  Stay active. Exercise for at least 30 minutes on 5 or more days each week.  Do not use any products that contain nicotine or tobacco, such as cigarettes, e-cigarettes, and chewing tobacco. If you need help quitting, ask your health care provider.  If you are sexually active, practice safe sex. Use a condom or other form of protection to prevent STIs (sexually transmitted infections).  Talk with your health care provider about taking a low-dose aspirin every [place] starting at age 37. What's next?  Go to your health care provider once a year for a well check visit.  Ask your health care provider how often you should have your eyes and teeth checked.  Stay up to date on all vaccines. This information is not intended to replace advice given to you by your health care provider. Make sure you discuss any questions you have with your health care provider. Document Revised: 11/14/2018 Document Reviewed: 11/14/2018 Elsevier Patient Education  2020 [employer].

## 2020-08-05 ENCOUNTER — Encounter: Payer: Self-pay | Admitting: Internal Medicine

## 2020-08-11 DIAGNOSIS — M545 Low back pain: Secondary | ICD-10-CM | POA: Diagnosis not present

## 2020-08-16 DIAGNOSIS — M545 Low back pain: Secondary | ICD-10-CM | POA: Diagnosis not present

## 2020-08-20 ENCOUNTER — Ambulatory Visit (INDEPENDENT_AMBULATORY_CARE_PROVIDER_SITE_OTHER): Payer: BC Managed Care – PPO | Admitting: Physician Assistant

## 2020-08-20 ENCOUNTER — Other Ambulatory Visit: Payer: Self-pay

## 2020-08-20 ENCOUNTER — Encounter: Payer: Self-pay | Admitting: Physician Assistant

## 2020-08-20 VITALS — BP 120/82 | HR 81 | Temp 98.3°F | Resp 16 | Ht 72.5 in | Wt 211.0 lb

## 2020-08-20 DIAGNOSIS — M545 Low back pain: Secondary | ICD-10-CM | POA: Diagnosis not present

## 2020-08-20 DIAGNOSIS — Z23 Encounter for immunization: Secondary | ICD-10-CM

## 2020-08-20 DIAGNOSIS — I10 Essential (primary) hypertension: Secondary | ICD-10-CM | POA: Diagnosis not present

## 2020-08-20 NOTE — Progress Notes (Signed)
Patient presents to clinic today for follow-up of hypertension. At last visit Losartan was increased to 100 mg daily due to multiple significant BP elevations and recent visits with other providers. Was continued on same dose of HCTZ. DASH diet again encouraged. Since last visit, patient endorses taking medications as directed. Notes tolerating well, stating he had a little lightheadedness with first dose but none since. Patient denies chest pain, palpitations, dizziness, vision changes or frequent headaches. Has been checking BP at home and noting staying in normal range now.   Past Medical History:  Diagnosis Date  . Allergic rhinitis   . ED (erectile dysfunction)   . GERD (gastroesophageal reflux disease)   . HLD (hyperlipidemia)   . HTN (hypertension)   . Lumbar radiculopathy    lumbar foraminal disc herniation  . Melanoma ([place])    hx  . Pneumonia     Current Outpatient Medications on File Prior to Visit  Medication Sig Dispense Refill  . ondansetron (ZOFRAN) 4 MG tablet Zofran 4 mg tablet   4 mg by oral route.    Marland Kitchen atorvastatin (LIPITOR) 10 MG tablet Take 10 mg by mouth daily with lunch.     . gabapentin (NEURONTIN) 300 MG capsule gabapentin 300 mg capsule    . hydrochlorothiazide (MICROZIDE) 12.5 MG capsule Take 12.5 mg by mouth daily with lunch.     . losartan (COZAAR) 100 MG tablet Take 1 tablet (100 mg total) by mouth daily. 90 tablet 3  . vitamin B-12 (CYANOCOBALAMIN) 1000 MCG tablet Take 1,000 mcg by mouth daily.     No current facility-administered medications on file prior to visit.    Allergies  Allergen Reactions  . Avelox [Moxifloxacin Hcl In Nacl] Hives, Itching and Rash    Family History  Problem Relation Age of Onset  . Colon polyps Brother   . Non-Hodgkin's lymphoma Father   . Colon cancer Neg Hx   . Stomach cancer Neg Hx     Social History   Socioeconomic History  . Marital status: Married    Spouse name: Not on file  . Number of children: Not  on file  . Years of education: Not on file  . Highest education level: Not on file  Occupational History  . Not on file  Tobacco Use  . Smoking status: Never Smoker  . Smokeless tobacco: Never Used  Vaping Use  . Vaping Use: Never used  Substance and Sexual Activity  . Alcohol use: No  . Drug use: No  . Sexual activity: Yes  Other Topics Concern  . Not on file  Social History Narrative   Married, lives with wife, does not get regular exercise.    Social Determinants of Health   Financial Resource Strain:   . Difficulty of Paying Living Expenses: Not on file  Food Insecurity:   . Worried About Charity fundraiser in the Last Year: Not on file  . Ran Out of Food in the Last Year: Not on file  Transportation Needs:   . Lack of Transportation (Medical): Not on file  . Lack of Transportation (Non-Medical): Not on file  Physical Activity:   . Days of Exercise per Week: Not on file  . Minutes of Exercise per Session: Not on file  Stress:   . Feeling of Stress : Not on file  Social Connections:   . Frequency of Communication with Friends and Family: Not on file  . Frequency of Social Gatherings with Friends and Family: Not on  file  . Attends Religious Services: Not on file  . Active Member of Clubs or Organizations: Not on file  . Attends Archivist Meetings: Not on file  . Marital Status: Not on file   Review of Systems - See HPI.  All other ROS are negative.  There were no vitals taken for this visit.  Physical Exam Vitals reviewed.  Constitutional:      Appearance: Normal appearance.  HENT:     Head: Normocephalic and atraumatic.  Cardiovascular:     Rate and Rhythm: Normal rate and regular rhythm.     Pulses: Normal pulses.     Heart sounds: Normal heart sounds.  Musculoskeletal:     Cervical back: Neck supple.  Neurological:     General: No focal deficit present.     Mental Status: He is alert.  Psychiatric:        Mood and Affect: Mood normal.      Recent Results (from the past 2160 hour(s))  Surgical pcr screen     Status: None   Collection Time: 06/28/20 10:59 AM   Specimen: Nasal Mucosa; Nasal Swab  Result Value Ref Range   MRSA, PCR NEGATIVE NEGATIVE   Staphylococcus aureus NEGATIVE NEGATIVE    Comment: (NOTE) The Xpert SA Assay (FDA approved for NASAL specimens in patients 73 years of age and older), is one component of a comprehensive surveillance program. It is not intended to diagnose infection nor to guide or monitor treatment. Performed at [facility], [place] [address] [place], [place] [address]   APTT     Status: None   Collection Time: 06/28/20 11:00 AM  Result Value Ref Range   aPTT 24 24 - 36 seconds    Comment: Performed at [facility] [address], [place], [place] [address]  Basic metabolic panel     Status: None   Collection Time: 06/28/20 11:00 AM  Result Value Ref Range   Sodium 137 135 - 145 mmol/L   Potassium 3.9 3.5 - 5.1 mmol/L   Chloride 101 98 - 111 mmol/L   CO2 28 22 - 32 mmol/L   Glucose, Bld 97 70 - 99 mg/dL    Comment: Glucose reference range applies only to samples taken after fasting for at least 8 hours.   BUN 15 6 - 20 mg/dL   Creatinine, Ser 0.88 0.61 - 1.24 mg/dL   Calcium 9.5 8.9 - 10.3 mg/dL   GFR calc non Af Amer >60 >60 mL/min   GFR calc Af Amer >60 >60 mL/min   Anion gap 8 5 - 15    Comment: Performed at [facility] [address] [place] [address]  CBC     Status: None   Collection Time: 06/28/20 11:00 AM  Result Value Ref Range   WBC 6.6 4.0 - 10.5 K/uL   RBC 5.73 4.22 - 5.81 MIL/uL   Hemoglobin 16.7 13.0 - 17.0 g/dL   HCT 50.6 39 - 52 %   MCV 88.3 80.0 - 100.0 fL   MCH 29.1 26.0 - 34.0 pg   MCHC 33.0 30.0 - 36.0 g/dL   RDW 11.9 11.5 - 15.5 %   Platelets 242 150 - 400 K/uL   nRBC 0.0 0.0 - 0.2 %    Comment: Performed at [facility], [place] [address] [place],  [address]  Protime-INR     Status: None   Collection  Time: 06/28/20 11:00 AM  Result  Value Ref Range   Prothrombin Time 12.4 11.4 - 15.2 seconds   INR 1.0 0.8 - 1.2    Comment: (NOTE) INR goal varies based on device and disease states. Performed at [facility], [place] [address], [place], [place] [address]   Urinalysis, Routine w reflex microscopic     Status: Abnormal   Collection Time: 06/28/20 11:00 AM  Result Value Ref Range   Color, Urine YELLOW YELLOW   APPearance CLEAR CLEAR   Specific Gravity, Urine 1.025 1.005 - 1.030   pH 5.0 5.0 - 8.0   Glucose, UA NEGATIVE NEGATIVE mg/dL   Hgb urine dipstick MODERATE (A) NEGATIVE   Bilirubin Urine NEGATIVE NEGATIVE   Ketones, ur NEGATIVE NEGATIVE mg/dL   Protein, ur NEGATIVE NEGATIVE mg/dL   Nitrite NEGATIVE NEGATIVE   Leukocytes,Ua NEGATIVE NEGATIVE   RBC / HPF 0-5 0 - 5 RBC/hpf   WBC, UA 0-5 0 - 5 WBC/hpf   Bacteria, UA NONE SEEN NONE SEEN   Squamous Epithelial / LPF 0-5 0 - 5   Mucus PRESENT     Comment: Performed at [facility], [place] [address], [place], [place] [address]  SARS CORONAVIRUS 2 (TAT 6-24 HRS) Nasopharyngeal Nasopharyngeal Swab     Status: None   Collection Time: 06/28/20 12:06 PM   Specimen: Nasopharyngeal Swab  Result Value Ref Range   SARS Coronavirus 2 NEGATIVE NEGATIVE    Comment: (NOTE) SARS-CoV-2 target nucleic acids are NOT DETECTED.  The SARS-CoV-2 RNA is generally detectable in upper and lower respiratory specimens during the acute phase of infection. Negative results do not preclude SARS-CoV-2 infection, do not rule out co-infections with other pathogens, and should not be used as the sole basis for treatment or other patient management decisions. Negative results must be combined with clinical observations, patient history, and epidemiological information. The expected result is Negative.  Fact Sheet for Patients: SugarRoll.be  Fact Sheet for Healthcare  Providers: https://www.woods-mathews.com/  This test is not yet approved or cleared by the [place] FDA and  has been authorized for detection and/or diagnosis of SARS-CoV-2 by FDA under an Emergency Use Authorization (EUA). This EUA will remain  in effect (meaning this test can be used) for the duration of the COVID-19 declaration under Se ction 564(b)(1) of the Act, 21 U.S.C. section 360bbb-3(b)(1), unless the authorization is terminated or revoked sooner.  Performed at [facility], [place] [address], [place], [place] [address]   CBC with Differential/Platelet     Status: Abnormal   Collection Time: 08/02/20  9:00 AM  Result Value Ref Range   WBC 5.2 4.0 - 10.5 K/uL   RBC 5.19 4.22 - 5.81 Mil/uL   Hemoglobin 15.7 13.0 - 17.0 g/dL   HCT 45.9 39 - 52 %   MCV 88.4 78.0 - 100.0 fl   MCHC 34.2 30.0 - 36.0 g/dL   RDW 13.5 11.5 - 15.5 %   Platelets 242.0 150 - 400 K/uL   Neutrophils Relative % 63.2 43 - 77 %   Lymphocytes Relative 22.1 12 - 46 %   Monocytes Relative 7.3 3 - 12 %   Eosinophils Relative 6.1 (H) 0 - 5 %   Basophils Relative 1.3 0 - 3 %   Neutro Abs 3.3 1.4 - 7.7 K/uL   Lymphs Abs 1.2 0.7 - 4.0 K/uL   Monocytes Absolute 0.4 0 - 1 K/uL   Eosinophils Absolute 0.3 0 - 0 K/uL   Basophils Absolute 0.1 0 -  0 K/uL  Comprehensive metabolic panel     Status: None   Collection Time: 08/02/20  9:00 AM  Result Value Ref Range   Sodium 140 135 - 145 mEq/L   Potassium 4.1 3.5 - 5.1 mEq/L   Chloride 102 96 - 112 mEq/L   CO2 29 19 - 32 mEq/L   Glucose, Bld 90 70 - 99 mg/dL   BUN 14 6 - 23 mg/dL   Creatinine, Ser 0.83 0.40 - 1.50 mg/dL   Total Bilirubin 1.1 0.2 - 1.2 mg/dL   Alkaline Phosphatase 63 39 - 117 U/L   AST 18 0 - 37 U/L   ALT 29 0 - 53 U/L   Total Protein 6.9 6.0 - 8.3 g/dL   Albumin 4.5 3.5 - 5.2 g/dL   GFR 95.97 >60.00 mL/min   Calcium 9.5 8.4 - 10.5 mg/dL  Hemoglobin A1c     Status: None   Collection Time: 08/02/20  9:00 AM  Result Value  Ref Range   Hgb A1c MFr Bld 5.3 4.6 - 6.5 %    Comment: Glycemic Control Guidelines for People with Diabetes:Non Diabetic:  <6%Goal of Therapy: <7%Additional Action Suggested:  >8%   Lipid panel     Status: None   Collection Time: 08/02/20  9:00 AM  Result Value Ref Range   Cholesterol 165 0 - 200 mg/dL    Comment: ATP III Classification       Desirable:  < 200 mg/dL               Borderline High:  200 - 239 mg/dL          High:  > = 240 mg/dL   Triglycerides 140.0 0 - 149 mg/dL    Comment: Normal:  <150 mg/dLBorderline High:  150 - 199 mg/dL   HDL 50.20 >39.00 mg/dL   VLDL 28.0 0.0 - 40.0 mg/dL   LDL Cholesterol 86 0 - 99 mg/dL   Total CHOL/HDL Ratio 3     Comment:                Men          Women1/2 Average Risk     3.4          3.3Average Risk          5.0          4.42X Average Risk          9.6          7.13X Average Risk          15.0          11.0                       NonHDL 114.38     Comment: NOTE:  Non-HDL goal should be 30 mg/dL higher than patient's LDL goal (i.e. LDL goal of < 70 mg/dL, would have non-HDL goal of < 100 mg/dL)  PSA     Status: None   Collection Time: 08/02/20  9:00 AM  Result Value Ref Range   PSA 0.54 0.10 - 4.00 ng/mL    Comment: Test performed using Access Hybritech PSA Assay, a parmagnetic partical, chemiluminecent immunoassay.    Assessment/Plan: 1. Essential hypertension BP improved. Steady normotensive readings at home. Continue current regimen. Follow-up in 3 months.   This visit occurred during the SARS-CoV-2 public health emergency.  Safety protocols were in place, including screening questions prior to the visit, additional  usage of staff PPE, and extensive cleaning of exam room while observing appropriate contact time as indicated for disinfecting solutions.     Leeanne Rio, PA-C

## 2020-08-20 NOTE — Patient Instructions (Signed)
Please continue current medication regimen. BP looks good.  Be sure to check a couple of times per week and record.   Follow-up in 3 months.  Sooner if needed.

## 2020-08-23 DIAGNOSIS — M545 Low back pain: Secondary | ICD-10-CM | POA: Diagnosis not present

## 2020-09-30 ENCOUNTER — Ambulatory Visit: Payer: BC Managed Care – PPO

## 2020-11-19 ENCOUNTER — Ambulatory Visit (INDEPENDENT_AMBULATORY_CARE_PROVIDER_SITE_OTHER): Payer: BC Managed Care – PPO | Admitting: Physician Assistant

## 2020-11-19 ENCOUNTER — Encounter: Payer: Self-pay | Admitting: Physician Assistant

## 2020-11-19 ENCOUNTER — Other Ambulatory Visit: Payer: Self-pay

## 2020-11-19 DIAGNOSIS — I10 Essential (primary) hypertension: Secondary | ICD-10-CM

## 2020-11-19 MED ORDER — HYDROCHLOROTHIAZIDE 12.5 MG PO CAPS: 12.5000 mg | ORAL_CAPSULE | Freq: Every day | ORAL | 3 refills | Status: DC

## 2020-11-19 MED ORDER — LOSARTAN POTASSIUM 100 MG PO TABS: 100.0000 mg | ORAL_TABLET | Freq: Every day | ORAL | 3 refills | Status: DC

## 2020-11-19 NOTE — Progress Notes (Signed)
Patient presents to clinic today for follow-up of hypertension. Patient is currently on a regimen of losartan 100 mg daily and HCTZ 12.5 mg daily. Endorses taking medications as directed and tolerating well. Patient denies chest pain, palpitations, lightheadedness, dizziness, vision changes or frequent headaches.   Past Medical History:  Diagnosis Date  . Allergic rhinitis   . ED (erectile dysfunction)   . GERD (gastroesophageal reflux disease)   . HLD (hyperlipidemia)   . HTN (hypertension)   . Lumbar radiculopathy    lumbar foraminal disc herniation  . Melanoma ([place])    hx  . Pneumonia     Current Outpatient Medications on File Prior to Visit  Medication Sig Dispense Refill  . atorvastatin (LIPITOR) 10 MG tablet Take 10 mg by mouth daily with lunch.     No current facility-administered medications on file prior to visit.    Allergies  Allergen Reactions  . Avelox [Moxifloxacin Hcl In Nacl] Hives, Itching and Rash    Family History  Problem Relation Age of Onset  . Colon polyps Brother   . Non-Hodgkin's lymphoma Father   . Colon cancer Neg Hx   . Stomach cancer Neg Hx     Social History   Socioeconomic History  . Marital status: Married    Spouse name: Not on file  . Number of children: Not on file  . Years of education: Not on file  . Highest education level: Not on file  Occupational History  . Not on file  Tobacco Use  . Smoking status: Never Smoker  . Smokeless tobacco: Never Used  Vaping Use  . Vaping Use: Never used  Substance and Sexual Activity  . Alcohol use: No  . Drug use: No  . Sexual activity: Yes  Other Topics Concern  . Not on file  Social History Narrative   Married, lives with wife, does not get regular exercise.    Social Determinants of Health   Financial Resource Strain: Not on file  Food Insecurity: Not on file  Transportation Needs: Not on file  Physical Activity: Not on file  Stress: Not on file  Social Connections: Not on  file   Review of Systems - See HPI.  All other ROS are negative.  BP 122/78   Pulse 69   Temp 98.4 F (36.9 C) (Temporal)   Resp 16   Ht 6' 0.5" (1.842 m)   Wt 215 lb (97.5 kg)   SpO2 98%   BMI 28.76 kg/m   Physical Exam Vitals reviewed.  Constitutional:      Appearance: Normal appearance.  HENT:     Head: Normocephalic and atraumatic.     Right Ear: Tympanic membrane normal.     Left Ear: Tympanic membrane normal.  Eyes:     Conjunctiva/sclera: Conjunctivae normal.     Pupils: Pupils are equal, round, and reactive to light.  Cardiovascular:     Rate and Rhythm: Normal rate and regular rhythm.     Pulses: Normal pulses.     Heart sounds: Normal heart sounds.  Musculoskeletal:     Cervical back: Neck supple.  Neurological:     General: No focal deficit present.     Mental Status: He is alert and oriented to person, place, and time.  Psychiatric:        Mood and Affect: Mood normal.    Assessment/Plan: 1. Essential hypertension BP stable. Asymptomatic. Continue current regimen. Has DOT in 6 months.  - losartan (COZAAR) 100 MG tablet; Take  1 tablet (100 mg total) by mouth daily.  Dispense: 90 tablet; Refill: 3  This visit occurred during the SARS-CoV-2 public health emergency.  Safety protocols were in place, including screening questions prior to the visit, additional usage of staff PPE, and extensive cleaning of exam room while observing appropriate contact time as indicated for disinfecting solutions.     Leeanne Rio, PA-C

## 2020-11-19 NOTE — Patient Instructions (Signed)
I am glad you are doing well.  Please continue current regimen and watching your diet.  We will follow-up yearly for BP as long as it remains stable since you are also seeing another provider for DOT physicals in the interim.    DASH Eating Plan DASH stands for "Dietary Approaches to Stop Hypertension." The DASH eating plan is a healthy eating plan that has been shown to reduce high blood pressure (hypertension). It may also reduce your risk for type 2 diabetes, heart disease, and stroke. The DASH eating plan may also help with weight loss. What are tips for following this plan?  General guidelines  Avoid eating more than 2,300 mg (milligrams) of salt (sodium) a Gallus. If you have hypertension, you may need to reduce your sodium intake to 1,500 mg a Stejskal.  Limit alcohol intake to no more than 1 drink a Smucker for nonpregnant women and 2 drinks a Hyslop for men. One drink equals 12 oz of beer, 5 oz of wine, or 1 oz of hard liquor.  Work with your health care provider to maintain a healthy body weight or to lose weight. Ask what an ideal weight is for you.  Get at least 30 minutes of exercise that causes your heart to beat faster (aerobic exercise) most days of the week. Activities may include walking, swimming, or biking.  Work with your health care provider or diet and nutrition specialist (dietitian) to adjust your eating plan to your individual calorie needs. Reading food labels   Check food labels for the amount of sodium per serving. Choose foods with less than 5 percent of the Daily Value of sodium. Generally, foods with less than 300 mg of sodium per serving fit into this eating plan.  To find whole grains, look for the word "whole" as the first word in the ingredient list. Shopping  Buy products labeled as "low-sodium" or "no salt added."  Buy fresh foods. Avoid canned foods and premade or frozen meals. Cooking  Avoid adding salt when cooking. Use salt-free seasonings or herbs instead  of table salt or sea salt. Check with your health care provider or pharmacist before using salt substitutes.  Do not fry foods. Cook foods using healthy methods such as baking, boiling, grilling, and broiling instead.  Cook with heart-healthy oils, such as olive, canola, soybean, or sunflower oil. Meal planning  Eat a balanced diet that includes: ? 5 or more servings of fruits and vegetables each Grzelak. At each meal, try to fill half of your plate with fruits and vegetables. ? Up to 6-8 servings of whole grains each Grimley. ? Less than 6 oz of lean meat, poultry, or fish each Bennetts. A 3-oz serving of meat is about the same size as a deck of cards. One egg equals 1 oz. ? 2 servings of low-fat dairy each Hagood. ? A serving of nuts, seeds, or beans 5 times each week. ? Heart-healthy fats. Healthy fats called Omega-3 fatty acids are found in foods such as flaxseeds and coldwater fish, like sardines, salmon, and mackerel.  Limit how much you eat of the following: ? Canned or prepackaged foods. ? Food that is high in trans fat, such as fried foods. ? Food that is high in saturated fat, such as fatty meat. ? Sweets, desserts, sugary drinks, and other foods with added sugar. ? Full-fat dairy products.  Do not salt foods before eating.  Try to eat at least 2 vegetarian meals each week.  Eat more home-cooked food  and less restaurant, buffet, and fast food.  When eating at a restaurant, ask that your food be prepared with less salt or no salt, if possible. What foods are recommended? The items listed may not be a complete list. Talk with your dietitian about what dietary choices are best for you. Grains Whole-grain or whole-wheat bread. Whole-grain or whole-wheat pasta. Brown rice. Modena Morrow. Bulgur. Whole-grain and low-sodium cereals. Pita bread. Low-fat, low-sodium crackers. Whole-wheat flour tortillas. Vegetables Fresh or frozen vegetables (raw, steamed, roasted, or grilled). Low-sodium or  reduced-sodium tomato and vegetable juice. Low-sodium or reduced-sodium tomato sauce and tomato paste. Low-sodium or reduced-sodium canned vegetables. Fruits All fresh, dried, or frozen fruit. Canned fruit in natural juice (without added sugar). Meat and other protein foods Skinless chicken or [place]. Ground chicken or [place]. Pork with fat trimmed off. Fish and seafood. Egg whites. Dried beans, peas, or lentils. Unsalted nuts, nut butters, and seeds. Unsalted canned beans. Lean cuts of beef with fat trimmed off. Low-sodium, lean deli meat. Dairy Low-fat (1%) or fat-free (skim) milk. Fat-free, low-fat, or reduced-fat cheeses. Nonfat, low-sodium ricotta or cottage cheese. Low-fat or nonfat yogurt. Low-fat, low-sodium cheese. Fats and oils Soft margarine without trans fats. Vegetable oil. Low-fat, reduced-fat, or light mayonnaise and salad dressings (reduced-sodium). Canola, safflower, olive, soybean, and sunflower oils. Avocado. Seasoning and other foods Herbs. Spices. Seasoning mixes without salt. Unsalted popcorn and pretzels. Fat-free sweets. What foods are not recommended? The items listed may not be a complete list. Talk with your dietitian about what dietary choices are best for you. Grains Baked goods made with fat, such as croissants, muffins, or some breads. Dry pasta or rice meal packs. Vegetables Creamed or fried vegetables. Vegetables in a cheese sauce. Regular canned vegetables (not low-sodium or reduced-sodium). Regular canned tomato sauce and paste (not low-sodium or reduced-sodium). Regular tomato and vegetable juice (not low-sodium or reduced-sodium). Angie Fava. Olives. Fruits Canned fruit in a light or heavy syrup. Fried fruit. Fruit in cream or butter sauce. Meat and other protein foods Fatty cuts of meat. Ribs. Fried meat. Berniece Salines. Sausage. Bologna and other processed lunch meats. Salami. Fatback. Hotdogs. Bratwurst. Salted nuts and seeds. Canned beans with added salt. Canned or  smoked fish. Whole eggs or egg yolks. Chicken or [place] with skin. Dairy Whole or 2% milk, cream, and half-and-half. Whole or full-fat cream cheese. Whole-fat or sweetened yogurt. Full-fat cheese. Nondairy creamers. Whipped toppings. Processed cheese and cheese spreads. Fats and oils Butter. Stick margarine. Lard. Shortening. Ghee. Bacon fat. Tropical oils, such as coconut, palm kernel, or palm oil. Seasoning and other foods Salted popcorn and pretzels. Onion salt, garlic salt, seasoned salt, table salt, and sea salt. Worcestershire sauce. Tartar sauce. Barbecue sauce. Teriyaki sauce. Soy sauce, including reduced-sodium. Steak sauce. Canned and packaged gravies. Fish sauce. Oyster sauce. Cocktail sauce. Horseradish that you find on the shelf. Ketchup. Mustard. Meat flavorings and tenderizers. Bouillon cubes. Hot sauce and Tabasco sauce. Premade or packaged marinades. Premade or packaged taco seasonings. Relishes. Regular salad dressings. Where to find more information:  National Heart, Lung, and [place]: https://wilson-eaton.com/  American Heart Association: www.heart.org Summary  The DASH eating plan is a healthy eating plan that has been shown to reduce high blood pressure (hypertension). It may also reduce your risk for type 2 diabetes, heart disease, and stroke.  With the DASH eating plan, you should limit salt (sodium) intake to 2,300 mg a Bennison. If you have hypertension, you may need to reduce your sodium intake to 1,500 mg a  Hoadley.  When on the DASH eating plan, aim to eat more fresh fruits and vegetables, whole grains, lean proteins, low-fat dairy, and heart-healthy fats.  Work with your health care provider or diet and nutrition specialist (dietitian) to adjust your eating plan to your individual calorie needs. This information is not intended to replace advice given to you by your health care provider. Make sure you discuss any questions you have with your health care provider. Document  Revised: 11/02/2017 Document Reviewed: 11/13/2016 Elsevier Patient Education  2020 [employer].

## 2021-03-25 IMAGING — CR DG CHEST 2V
2 series · 2 of 2 positions shown · non-contrast
Comparison: None.

CLINICAL DATA: Pre-admission for lumbar surgery.

EXAM:
CHEST - 2 VIEW

[w chest pa]
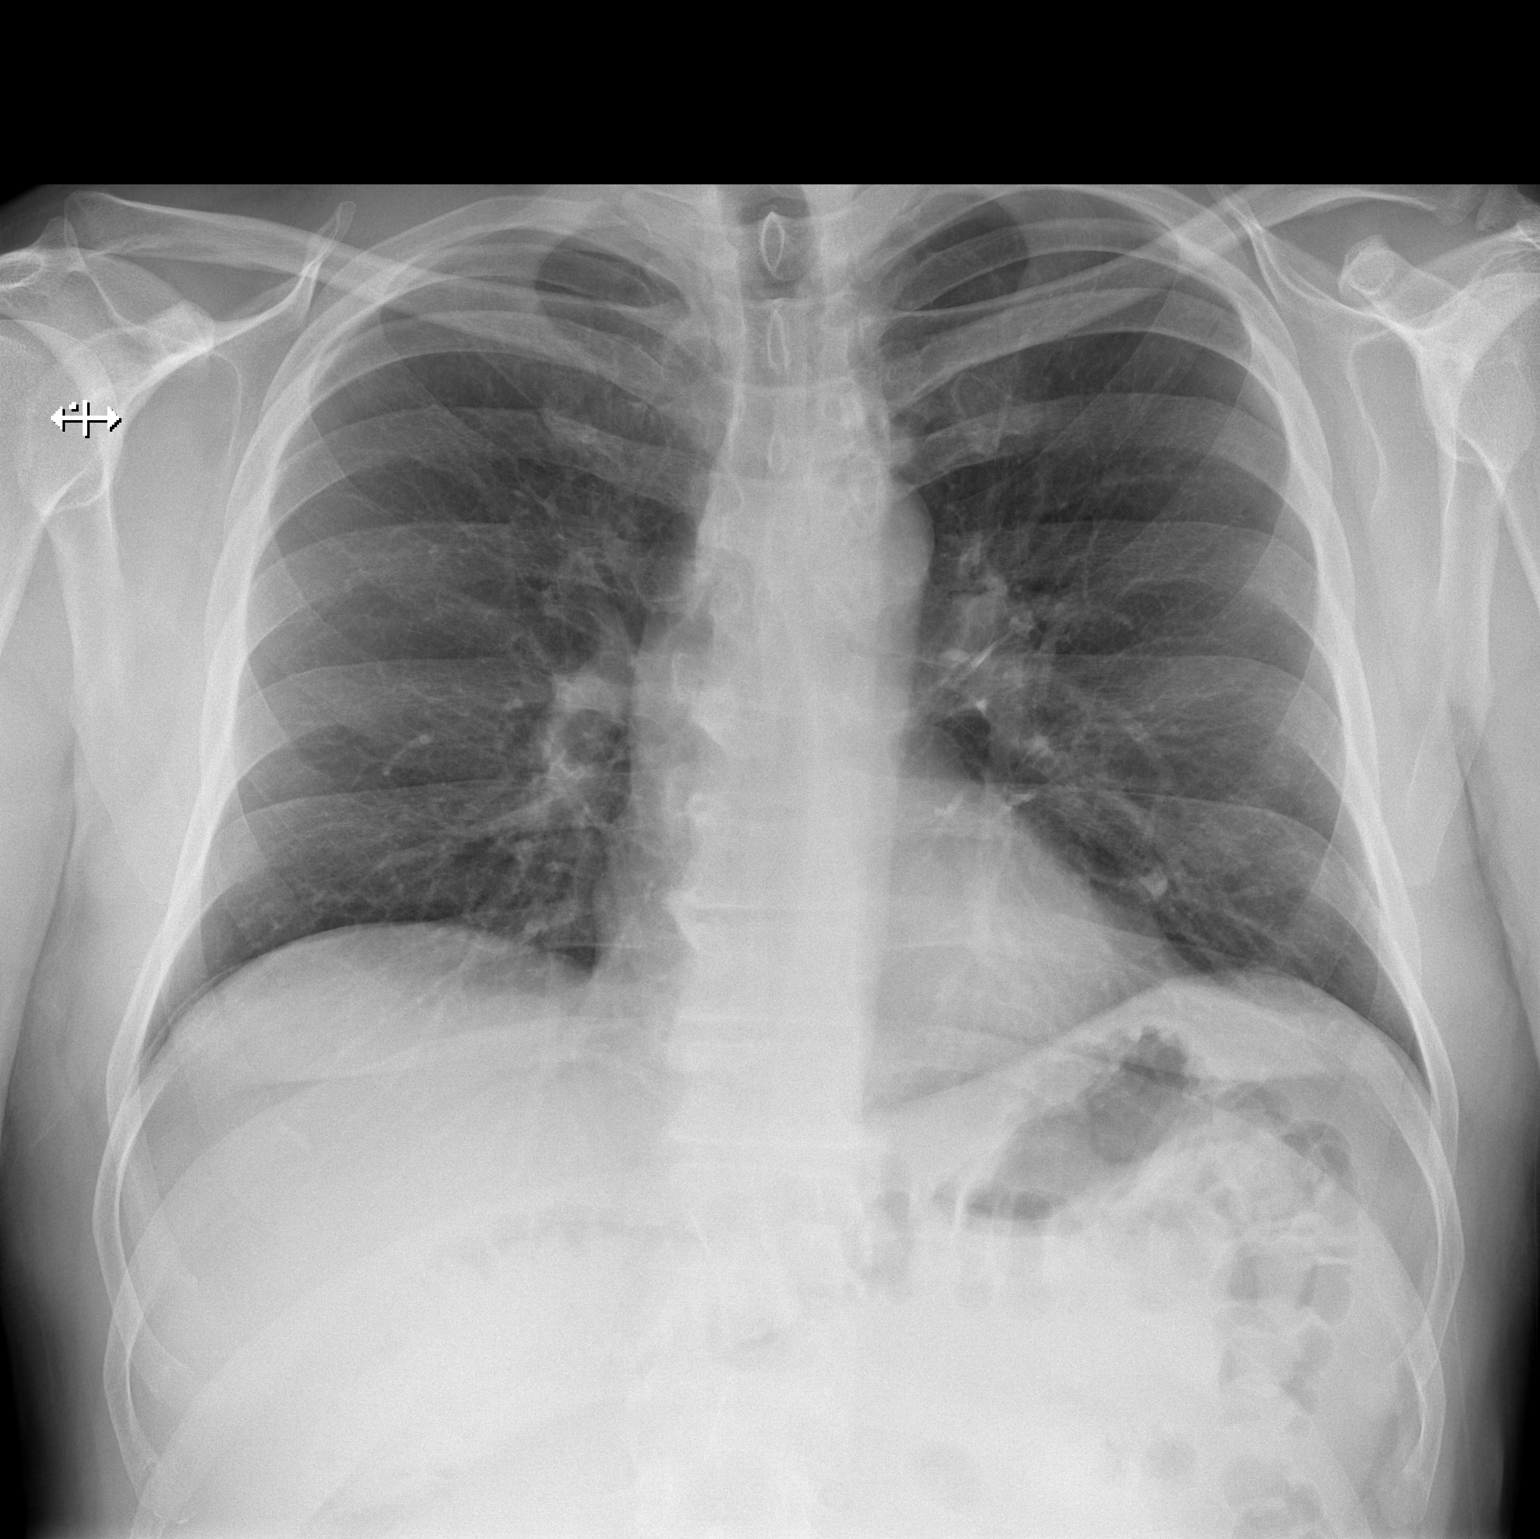

[w chest lat]
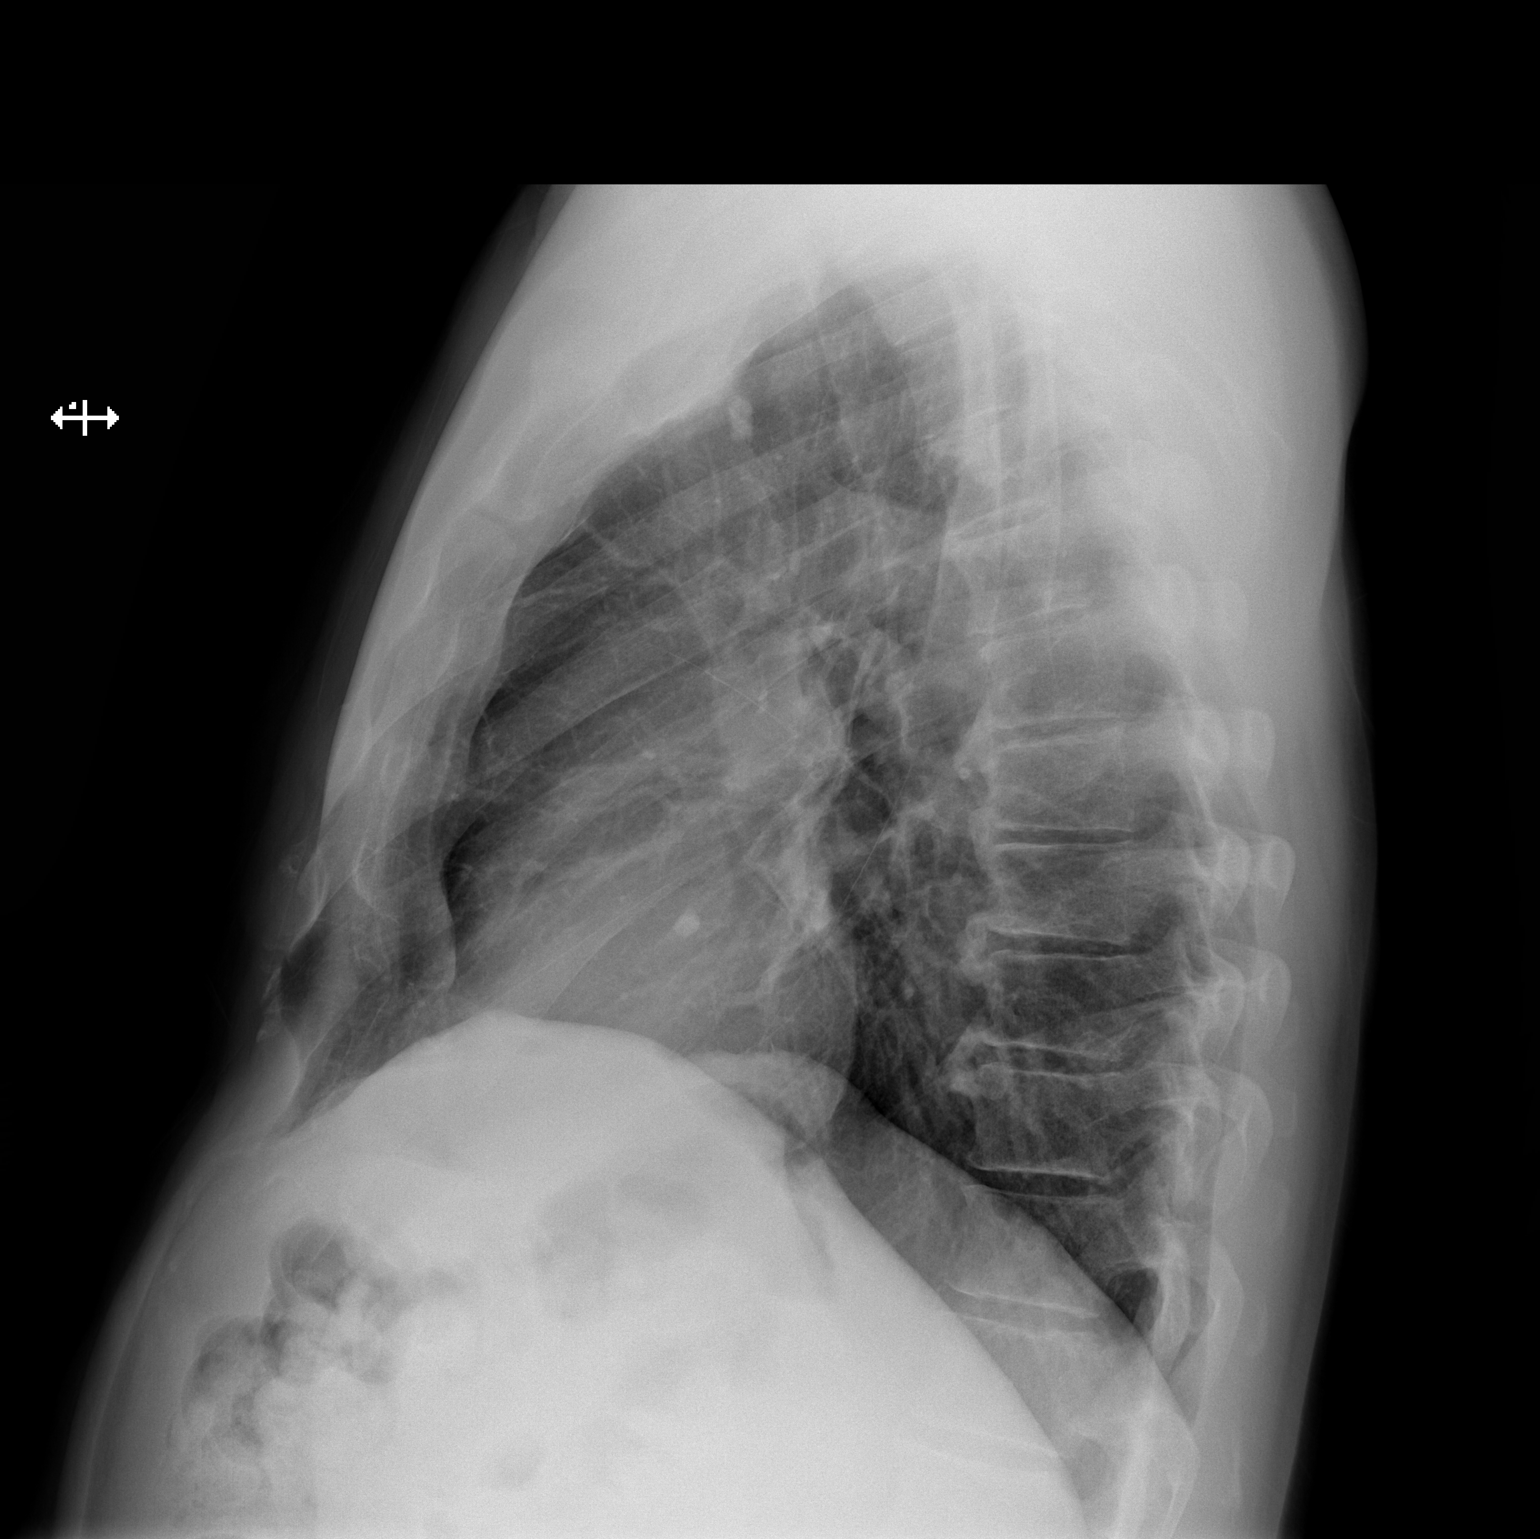

[2 of 2 positions shown; findings below may reference images not displayed]

FINDINGS: The heart size and mediastinal contours are within normal limits.
Both lungs are clear. The visualized skeletal structures are
unremarkable.
IMPRESSION: No active cardiopulmonary disease.

## 2021-03-27 IMAGING — RF DG LUMBAR SPINE 2-3V
1 series · 2 of 2 positions shown · non-contrast
Comparison: August 01, 2013.

CLINICAL DATA: L3-4 discectomy.

EXAM:
LUMBAR SPINE - 2-3 VIEW; DG C-ARM 1-60 MIN
Radiation exposure index: 11.09 mGy.

[Series 1: run · 2 of 2 slices shown]
[im 1/2]
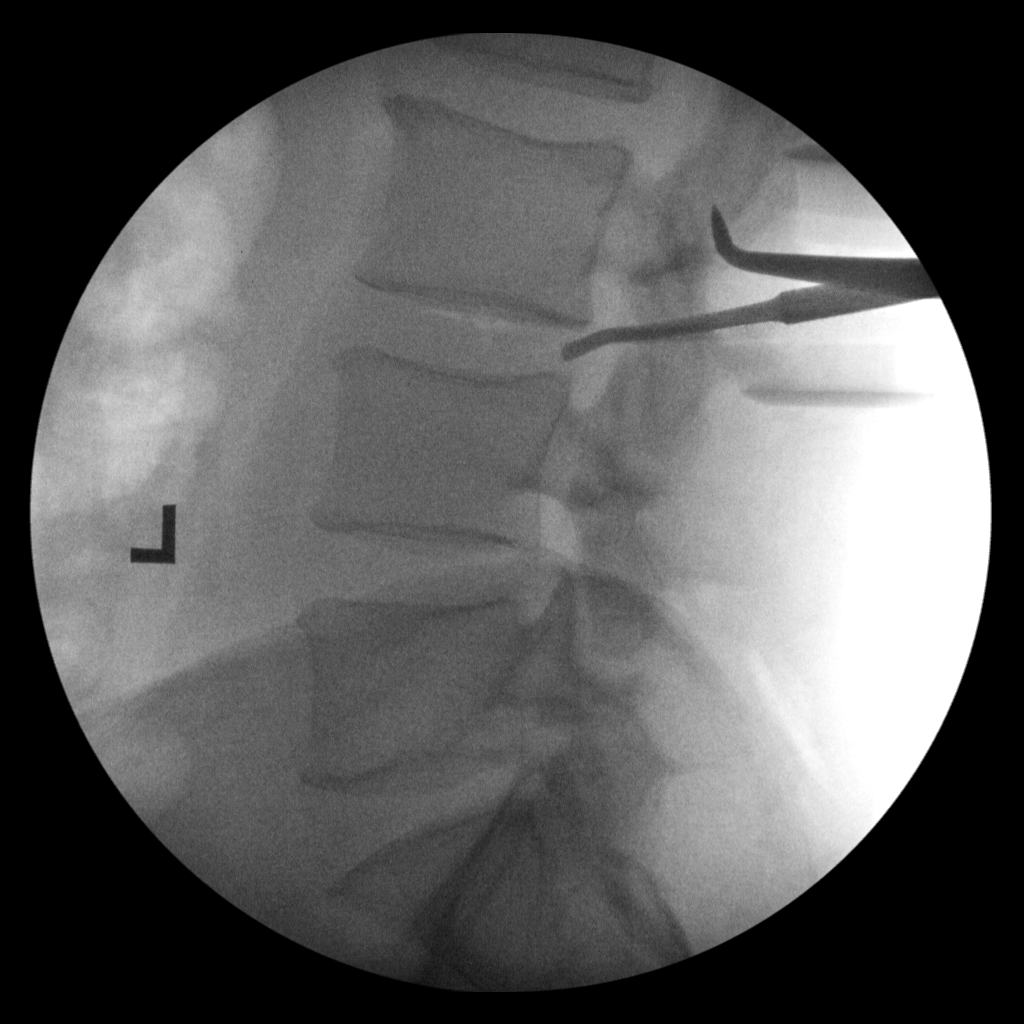
[im 2/2]
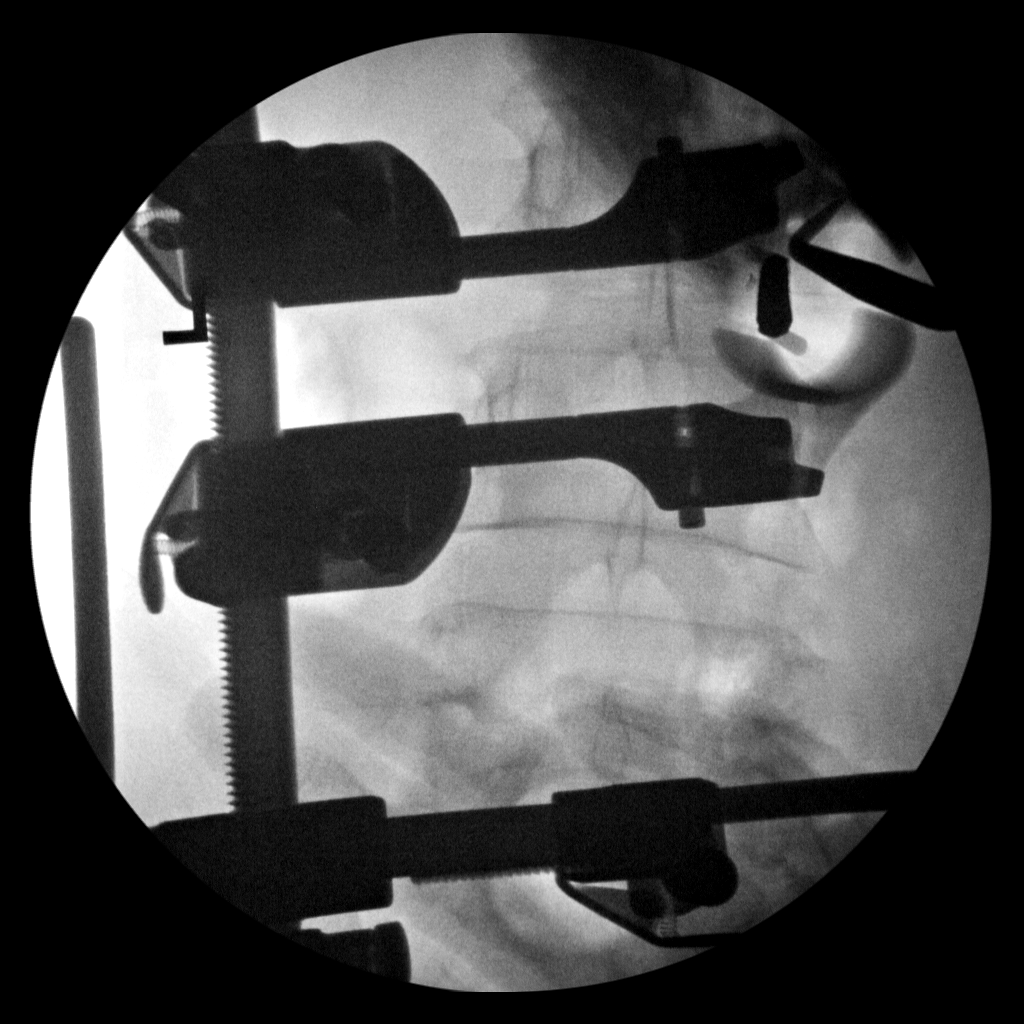

[2 of 2 positions shown; findings below may reference images not displayed]

FINDINGS: Two intraoperative fluoroscopic images were obtained of the lumbar
spine. These demonstrated surgical probe directed toward the
posterior margin of L3-4.
IMPRESSION: Fluoroscopic guidance provided during lumbar surgery.

## 2021-05-09 ENCOUNTER — Other Ambulatory Visit: Payer: Self-pay

## 2021-05-09 DIAGNOSIS — I10 Essential (primary) hypertension: Secondary | ICD-10-CM

## 2021-05-09 DIAGNOSIS — E785 Hyperlipidemia, unspecified: Secondary | ICD-10-CM

## 2021-05-09 MED ORDER — ATORVASTATIN CALCIUM 10 MG PO TABS: 10.0000 mg | ORAL_TABLET | Freq: Every day | ORAL | 0 refills | Status: AC

## 2021-05-23 DIAGNOSIS — D1801 Hemangioma of skin and subcutaneous tissue: Secondary | ICD-10-CM | POA: Diagnosis not present

## 2021-05-23 DIAGNOSIS — D2262 Melanocytic nevi of left upper limb, including shoulder: Secondary | ICD-10-CM | POA: Diagnosis not present

## 2021-05-23 DIAGNOSIS — D2261 Melanocytic nevi of right upper limb, including shoulder: Secondary | ICD-10-CM | POA: Diagnosis not present

## 2021-05-23 DIAGNOSIS — D225 Melanocytic nevi of trunk: Secondary | ICD-10-CM | POA: Diagnosis not present

## 2021-08-05 ENCOUNTER — Encounter: Payer: BC Managed Care – PPO | Admitting: Physician Assistant

## 2021-08-16 DIAGNOSIS — I10 Essential (primary) hypertension: Secondary | ICD-10-CM | POA: Diagnosis not present

## 2021-08-16 DIAGNOSIS — E7849 Other hyperlipidemia: Secondary | ICD-10-CM | POA: Diagnosis not present

## 2021-08-16 DIAGNOSIS — Z125 Encounter for screening for malignant neoplasm of prostate: Secondary | ICD-10-CM | POA: Diagnosis not present

## 2021-11-15 DIAGNOSIS — Z0000 Encounter for general adult medical examination without abnormal findings: Secondary | ICD-10-CM | POA: Diagnosis not present

## 2021-12-01 ENCOUNTER — Other Ambulatory Visit: Payer: Self-pay | Admitting: Family Medicine

## 2021-12-21 DIAGNOSIS — L720 Epidermal cyst: Secondary | ICD-10-CM | POA: Diagnosis not present

## 2022-03-27 DIAGNOSIS — D2261 Melanocytic nevi of right upper limb, including shoulder: Secondary | ICD-10-CM | POA: Diagnosis not present

## 2022-03-27 DIAGNOSIS — D1801 Hemangioma of skin and subcutaneous tissue: Secondary | ICD-10-CM | POA: Diagnosis not present

## 2022-03-27 DIAGNOSIS — D225 Melanocytic nevi of trunk: Secondary | ICD-10-CM | POA: Diagnosis not present

## 2022-03-27 DIAGNOSIS — L821 Other seborrheic keratosis: Secondary | ICD-10-CM | POA: Diagnosis not present

## 2022-11-16 DIAGNOSIS — E7849 Other hyperlipidemia: Secondary | ICD-10-CM | POA: Diagnosis not present

## 2022-11-16 DIAGNOSIS — Z0000 Encounter for general adult medical examination without abnormal findings: Secondary | ICD-10-CM | POA: Diagnosis not present

## 2022-11-16 DIAGNOSIS — I10 Essential (primary) hypertension: Secondary | ICD-10-CM | POA: Diagnosis not present

## 2022-11-16 DIAGNOSIS — Z125 Encounter for screening for malignant neoplasm of prostate: Secondary | ICD-10-CM | POA: Diagnosis not present

## 2023-01-09 DIAGNOSIS — L723 Sebaceous cyst: Secondary | ICD-10-CM | POA: Diagnosis not present

## 2023-01-09 DIAGNOSIS — D224 Melanocytic nevi of scalp and neck: Secondary | ICD-10-CM | POA: Diagnosis not present

## 2023-01-09 DIAGNOSIS — D225 Melanocytic nevi of trunk: Secondary | ICD-10-CM | POA: Diagnosis not present

## 2023-01-09 DIAGNOSIS — L738 Other specified follicular disorders: Secondary | ICD-10-CM | POA: Diagnosis not present

## 2023-11-05 DIAGNOSIS — D2261 Melanocytic nevi of right upper limb, including shoulder: Secondary | ICD-10-CM | POA: Diagnosis not present

## 2023-11-05 DIAGNOSIS — L738 Other specified follicular disorders: Secondary | ICD-10-CM | POA: Diagnosis not present

## 2023-11-05 DIAGNOSIS — D225 Melanocytic nevi of trunk: Secondary | ICD-10-CM | POA: Diagnosis not present

## 2023-11-05 DIAGNOSIS — L814 Other melanin hyperpigmentation: Secondary | ICD-10-CM | POA: Diagnosis not present

## 2023-11-21 DIAGNOSIS — Z125 Encounter for screening for malignant neoplasm of prostate: Secondary | ICD-10-CM | POA: Diagnosis not present

## 2023-11-21 DIAGNOSIS — E7849 Other hyperlipidemia: Secondary | ICD-10-CM | POA: Diagnosis not present

## 2023-11-21 DIAGNOSIS — I10 Essential (primary) hypertension: Secondary | ICD-10-CM | POA: Diagnosis not present

## 2023-11-21 DIAGNOSIS — Z0000 Encounter for general adult medical examination without abnormal findings: Secondary | ICD-10-CM | POA: Diagnosis not present

## 2024-05-27 DIAGNOSIS — K219 Gastro-esophageal reflux disease without esophagitis: Secondary | ICD-10-CM | POA: Diagnosis not present

## 2024-05-27 DIAGNOSIS — K429 Umbilical hernia without obstruction or gangrene: Secondary | ICD-10-CM | POA: Diagnosis not present

## 2024-05-27 DIAGNOSIS — E7849 Other hyperlipidemia: Secondary | ICD-10-CM | POA: Diagnosis not present

## 2024-05-27 DIAGNOSIS — I10 Essential (primary) hypertension: Secondary | ICD-10-CM | POA: Diagnosis not present

## 2024-09-08 DIAGNOSIS — D2261 Melanocytic nevi of right upper limb, including shoulder: Secondary | ICD-10-CM | POA: Diagnosis not present

## 2024-09-08 DIAGNOSIS — L821 Other seborrheic keratosis: Secondary | ICD-10-CM | POA: Diagnosis not present

## 2024-09-08 DIAGNOSIS — L80 Vitiligo: Secondary | ICD-10-CM | POA: Diagnosis not present

## 2024-09-23 ENCOUNTER — Encounter (INDEPENDENT_AMBULATORY_CARE_PROVIDER_SITE_OTHER): Payer: Self-pay | Admitting: *Deleted

## 2024-10-06 ENCOUNTER — Telehealth: Payer: Self-pay

## 2024-10-06 NOTE — Telephone Encounter (Signed)
 Who is your primary care physician: Dr.Rinka Pahwani  Reasons for the colonoscopy: Hx polyps   Have you had a colonoscopy before?  Yes 02-25-2015  Do you have family history of colon cancer? no  Previous colonoscopy with polyps removed? yes  Do you have a history colorectal cancer?   no  Are you diabetic? If yes, Type 1 or Type 2?    no  Do you have a prosthetic or mechanical heart valve? no  Do you have a pacemaker/defibrillator?   no  Have you had endocarditis/atrial fibrillation? no  Have you had joint replacement within the last 12 months?  no  Do you tend to be constipated or have to use laxatives? no  Do you have any history of drugs or alchohol?  no  Do you use supplemental oxygen?  no  Have you had a stroke or heart attack within the last 6 months? no  Do you take weight loss medication?  no   Do you take any blood-thinning medications such as: (aspirin, warfarin, Plavix, Aggrenox)  no  If yes we need the name, milligram, dosage and who is prescribing doctor  Current Outpatient Medications on File Prior to Visit  Medication Sig Dispense Refill   atorvastatin  (LIPITOR) 10 MG tablet Take 1 tablet (10 mg total) by mouth daily with lunch. 90 tablet 0   No current facility-administered medications on file prior to visit.    Allergies  Allergen Reactions   Moxifloxacin Itching, Rash and Dermatitis   Avelox [Moxifloxacin Hcl In Nacl] Hives, Itching and Rash     Pharmacy: [facility]   Primary Insurance Name: [employer] [id number]  Best number where you can be reached: [phone]

## 2024-11-05 NOTE — Telephone Encounter (Signed)
 Ok to schedule  ASA 2

## 2024-11-06 NOTE — Telephone Encounter (Signed)
 LMOVM to call back

## 2024-11-11 NOTE — Telephone Encounter (Signed)
 Pt wants to be scheduled in Jan. Will call once we get providers schedule for Jan.

## 2024-11-17 NOTE — Telephone Encounter (Signed)
LMVOM to return call. 

## 2024-11-17 NOTE — Telephone Encounter (Signed)
 LMOVM to return call  Pt's spouse left message returning call

## 2024-11-17 NOTE — Telephone Encounter (Signed)
 Pt's spouse called back and dates were given for Jan for procedure. She states she will talk to pt tonight and see which would best suite his schedule.

## 2024-11-18 ENCOUNTER — Other Ambulatory Visit: Payer: Self-pay | Admitting: *Deleted

## 2024-11-18 ENCOUNTER — Encounter: Payer: Self-pay | Admitting: *Deleted

## 2024-11-18 MED ORDER — PEG 3350-KCL-NA BICARB-NACL 420 G PO SOLR: 4000.0000 mL | Freq: Once | ORAL | 0 refills | Status: AC

## 2024-12-10 NOTE — Telephone Encounter (Signed)
 Pt has been scheduled for 12/19/24 with Dr.Carver. instructions mailed and prep sent to pharmacy. Pt's wife faxed over new insurance.

## 2024-12-11 ENCOUNTER — Encounter (INDEPENDENT_AMBULATORY_CARE_PROVIDER_SITE_OTHER): Payer: Self-pay | Admitting: *Deleted

## 2024-12-11 NOTE — Telephone Encounter (Signed)
 Referral completed, TCS apt letter sent to PCP

## 2024-12-12 NOTE — Telephone Encounter (Signed)
 [employer] PA: Approved , Authorization [id number]  Status Service From 12/10/2024 Service To 03/11/2025

## 2024-12-16 ENCOUNTER — Other Ambulatory Visit: Payer: Self-pay

## 2024-12-16 ENCOUNTER — Encounter (HOSPITAL_COMMUNITY): Payer: Self-pay

## 2024-12-16 ENCOUNTER — Encounter (HOSPITAL_COMMUNITY)
Admission: RE | Admit: 2024-12-16 | Discharge: 2024-12-16 | Disposition: A | Discharge status: Home / Self Care | Source: Ambulatory Visit | Attending: Internal Medicine | Admitting: Internal Medicine

## 2024-12-17 NOTE — Telephone Encounter (Signed)
 Pt's wife  states pt received message from [facility] stating that he would need to pre-pay for his procedure which is over $4,000. Advised her that he does not to have pay that up front, can ask to bill insurance first and then bill pt. Pt says he will set up payment arrangements if he needs to.

## 2024-12-19 ENCOUNTER — Ambulatory Visit (HOSPITAL_COMMUNITY): Admitting: Anesthesiology

## 2024-12-19 ENCOUNTER — Encounter (INDEPENDENT_AMBULATORY_CARE_PROVIDER_SITE_OTHER): Payer: Self-pay | Admitting: *Deleted

## 2024-12-19 ENCOUNTER — Ambulatory Visit (HOSPITAL_COMMUNITY)
Admission: RE | Admit: 2024-12-19 | Discharge: 2024-12-19 | Disposition: A | Discharge status: Home / Self Care | Attending: Internal Medicine | Admitting: Internal Medicine

## 2024-12-19 ENCOUNTER — Encounter (HOSPITAL_COMMUNITY): Admission: RE | Disposition: A | Payer: Self-pay | Source: Home / Self Care | Attending: Internal Medicine

## 2024-12-19 ENCOUNTER — Encounter (HOSPITAL_COMMUNITY): Payer: Self-pay | Admitting: Internal Medicine

## 2024-12-19 DIAGNOSIS — D125 Benign neoplasm of sigmoid colon: Secondary | ICD-10-CM | POA: Diagnosis not present

## 2024-12-19 DIAGNOSIS — K5730 Diverticulosis of large intestine without perforation or abscess without bleeding: Secondary | ICD-10-CM | POA: Insufficient documentation

## 2024-12-19 DIAGNOSIS — K648 Other hemorrhoids: Secondary | ICD-10-CM | POA: Diagnosis not present

## 2024-12-19 DIAGNOSIS — K219 Gastro-esophageal reflux disease without esophagitis: Secondary | ICD-10-CM | POA: Insufficient documentation

## 2024-12-19 DIAGNOSIS — Z1211 Encounter for screening for malignant neoplasm of colon: Secondary | ICD-10-CM | POA: Diagnosis not present

## 2024-12-19 DIAGNOSIS — K635 Polyp of colon: Secondary | ICD-10-CM | POA: Insufficient documentation

## 2024-12-19 DIAGNOSIS — I10 Essential (primary) hypertension: Secondary | ICD-10-CM | POA: Diagnosis not present

## 2024-12-19 HISTORY — PX: POLYPECTOMY: SHX149

## 2024-12-19 HISTORY — PX: COLONOSCOPY: SHX5424

## 2024-12-19 LAB — HM COLONOSCOPY

## 2024-12-19 MED ORDER — LACTATED RINGERS IV SOLN: INTRAVENOUS | Status: DC

## 2024-12-19 MED ORDER — PROPOFOL 500 MG/50ML IV EMUL
INTRAVENOUS | Status: DC | PRN
  Administered 2024-12-19: 150 ug/kg/min via INTRAVENOUS
  Administered 2024-12-19: 30 mg via INTRAVENOUS
  Administered 2024-12-19: 100 mg via INTRAVENOUS

## 2024-12-19 NOTE — H&P (Signed)
 Primary Care Physician:  Vernon Velna SAUNDERS, MD Primary Gastroenterologist:  Dr. Cindie  Pre-Procedure History & Physical: HPI:  Logan Hodge is a 59 y.o. male is here for a colonoscopy to be performed for surveillance purposes, personal history of adenomatous colon polyp in 2016  Past Medical History:  Diagnosis Date   Allergic rhinitis    ED (erectile dysfunction)    GERD (gastroesophageal reflux disease)    HLD (hyperlipidemia)    HTN (hypertension)    Lumbar radiculopathy    lumbar foraminal disc herniation   Melanoma (HCC)    hx   Pain in right knee 06/08/2020   Pneumonia     Past Surgical History:  Procedure Laterality Date   COLONOSCOPY N/A 02/25/2015   Procedure: COLONOSCOPY;  Surgeon: Margo LITTIE Haddock, MD;  [facility];  Service: Endoscopy;  Laterality: N/A;  915am - moved to 3/24 same time - Candy notified pt   ESOPHAGOGASTRODUODENOSCOPY N/A 02/25/2015   Procedure: ESOPHAGOGASTRODUODENOSCOPY (EGD);  Surgeon: Margo LITTIE Haddock, MD;  Location: [facility];  Service: Endoscopy;  Laterality: N/A;   ESOPHAGOGASTRODUODENOSCOPY N/A 08/20/2015   Procedure: ESOPHAGOGASTRODUODENOSCOPY (EGD);  Surgeon: Margo LITTIE Haddock, MD;  Location: [facility];  Service: Endoscopy;  Laterality: N/A;  315 - moved to 9/16 @ 9:30   HERNIA REPAIR Right 1997   inguinal   LASIK  2007   LIPOMA EXCISION  2005   L arm   LUMBAR LAMINECTOMY/DECOMPRESSION MICRODISCECTOMY Right 06/30/2020   Procedure: Right L3-4 foraminal discectomy;  Surgeon: Burnetta Aures, MD;  Location: [facility];  Service: Orthopedics;  Laterality: Right;   MELANOMA EXCISION  2004   right shoulder    WISDOM TOOTH EXTRACTION      Prior to Admission medications  Medication Sig Start Date End Date Taking? Authorizing Provider  atorvastatin  (LIPITOR) 10 MG tablet Take 1 tablet (10 mg total) by mouth daily with lunch. 05/09/21  Yes Webb, Padonda B, FNP  losartan -hydrochlorothiazide  (HYZAAR) 100-12.5 MG tablet Take 1 tablet by mouth  daily.   Yes [provider]    Allergies as of 11/18/2024 - Review Complete 10/06/2024  Allergen Reaction Noted   Moxifloxacin Itching, Rash, and Dermatitis 10/16/2013   Avelox [moxifloxacin hcl in nacl] Hives, Itching, and Rash 02/01/2015    Family History  Problem Relation Age of Onset   Colon polyps Brother    Non-Hodgkin's lymphoma Father    Colon cancer Neg Hx    Stomach cancer Neg Hx     Social History   Socioeconomic History   Marital status: Married    Spouse name: Not on file   Number of children: Not on file   Years of education: Not on file   Highest education level: Not on file  Occupational History   Not on file  Tobacco Use   Smoking status: Never   Smokeless tobacco: Never  Vaping Use   Vaping status: Never Used  Substance and Sexual Activity   Alcohol use: No   Drug use: No   Sexual activity: Yes  Other Topics Concern   Not on file  Social History Narrative   Married, lives with wife, does not get regular exercise.    Social Drivers of Health   Tobacco Use: Low Risk (12/19/2024)   Patient History    Smoking Tobacco Use: Never    Smokeless Tobacco Use: Never    Passive Exposure: Not on file  Financial Resource Strain: Not on file  Food Insecurity: Not on file  Transportation Needs:  Not on file  Physical Activity: Not on file  Stress: Not on file  Social Connections: Unknown (04/16/2022)   Received from [facility]   Social Network    Social Network: Not on file  Intimate Partner Violence: Unknown (03/08/2022)   Received from [facility]   HITS    Physically Hurt: Not on file    Insult or Talk Down To: Not on file    Threaten Physical Harm: Not on file    Scream or Curse: Not on file  Depression (PHQ2-9): Not on file  Alcohol Screen: Not on file  Housing: Not on file  Utilities: Not on file  Health Literacy: Not on file    Review of Systems: See HPI, otherwise negative ROS  Physical Exam: Vital signs in last 24  hours: Temp:  [98 F (36.7 C)] 98 F (36.7 C) (01/16 0746) Resp:  [18] 18 (01/16 0746) BP: (147)/(97) 147/97 (01/16 0746) SpO2:  [96 %] 96 % (01/16 0746)   General:   Alert,  Well-developed, well-nourished, pleasant and cooperative in NAD Head:  Normocephalic and atraumatic. Eyes:  Sclera clear, no icterus.   Conjunctiva pink. Ears:  Normal auditory acuity. Nose:  No deformity, discharge,  or lesions. Msk:  Symmetrical without gross deformities. Normal posture. Extremities:  Without clubbing or edema. Neurologic:  Alert and  oriented x4;  grossly normal neurologically. Skin:  Intact without significant lesions or rashes. Psych:  Alert and cooperative. Normal mood and affect.  Impression/Plan: Logan Hodge is here for a colonoscopy to be performed for surveillance purposes, personal history of adenomatous colon polyp in 2016  The risks of the procedure including infection, bleed, or perforation as well as benefits, limitations, alternatives and imponderables have been reviewed with the patient. Questions have been answered. All parties agreeable.

## 2024-12-19 NOTE — Discharge Instructions (Addendum)
" °  Colonoscopy Discharge Instructions  Read the instructions outlined below and refer to this sheet in the next few weeks. These discharge instructions provide you with general information on caring for yourself after you leave the hospital. Your doctor may also give you specific instructions. While your treatment has been planned according to the most current medical practices available, unavoidable complications occasionally occur.   ACTIVITY You may resume your regular activity, but move at a slower pace for the next 24 hours.  Take frequent rest periods for the next 24 hours.  Walking will help get rid of the air and reduce the bloated feeling in your belly (abdomen).  No driving for 24 hours (because of the medicine (anesthesia) used during the test).   Do not sign any important legal documents or operate any machinery for 24 hours (because of the anesthesia used during the test).  NUTRITION Drink plenty of fluids.  You may resume your normal diet as instructed by your doctor.  Begin with a light meal and progress to your normal diet. Heavy or fried foods are harder to digest and may make you feel sick to your stomach (nauseated).  Avoid alcoholic beverages for 24 hours or as instructed.  MEDICATIONS You may resume your normal medications unless your doctor tells you otherwise.  WHAT YOU CAN EXPECT TODAY Some feelings of bloating in the abdomen.  Passage of more gas than usual.  Spotting of blood in your stool or on the toilet paper.  IF YOU HAD POLYPS REMOVED DURING THE COLONOSCOPY: No aspirin products for 7 days or as instructed.  No alcohol for 7 days or as instructed.  Eat a soft diet for the next 24 hours.  FINDING OUT THE RESULTS OF YOUR TEST Not all test results are available during your visit. If your test results are not back during the visit, make an appointment with your caregiver to find out the results. Do not assume everything is normal if you have not heard from your  caregiver or the medical facility. It is important for you to follow up on all of your test results.  SEEK IMMEDIATE MEDICAL ATTENTION IF: You have more than a spotting of blood in your stool.  Your belly is swollen (abdominal distention).  You are nauseated or vomiting.  You have a temperature over 101.  You have abdominal pain or discomfort that is severe or gets worse throughout the [place].   Your colonoscopy revealed 2 small polyp(s) which I removed successfully. Await pathology results, my office will contact you. I recommend repeating colonoscopy in 7-10 years for surveillance purposes, depending on pathology results.  You also have diverticulosis and internal hemorrhoids. I would recommend increasing fiber in your diet or adding OTC Benefiber/Metamucil. Be sure to drink at least 4 to 6 glasses of water  daily. Follow-up with GI as needed.   I hope you have a great rest of your week!  Carlin POUR. Cindie, D.O. Gastroenterology and Hepatology [facility]  "

## 2024-12-19 NOTE — Anesthesia Postprocedure Evaluation (Signed)
"   Anesthesia Post Note  Patient: Logan Hodge  Procedure(s) Performed: COLONOSCOPY POLYPECTOMY, INTESTINE  Patient location during evaluation: Phase II Anesthesia Type: MAC Level of consciousness: awake Pain management: pain level controlled Vital Signs Assessment: post-procedure vital signs reviewed and stable Respiratory status: spontaneous breathing and respiratory function stable Cardiovascular status: blood pressure returned to baseline and stable Postop Assessment: no headache and no apparent nausea or vomiting Anesthetic complications: no Comments: Late entry   No notable events documented.   Last Vitals:  Vitals:   12/19/24 0854 12/19/24 0858  BP: (!) 80/54 98/67  Pulse: 74   Resp: 16   Temp: 36.7 C   SpO2: 95%     Last Pain:  Vitals:   12/19/24 0854  TempSrc: Oral  PainSc: 0-No pain                 Yvonna PARAS Ashey Tramontana      "

## 2024-12-19 NOTE — Transfer of Care (Signed)
 Immediate Anesthesia Transfer of Care Note  Patient: Logan Hodge  Procedure(s) Performed: COLONOSCOPY POLYPECTOMY, INTESTINE  Patient Location: Short Stay  Anesthesia Type:General  Level of Consciousness: drowsy  Airway & Oxygen Therapy: Patient Spontanous Breathing  Post-op Assessment: Report given to RN and Post -op Vital signs reviewed and stable  Post vital signs: Reviewed and stable  Last Vitals:  Vitals Value Taken Time  BP 80/54 12/19/24 08:54  Temp 36.7 C 12/19/24 08:54  Pulse 74 12/19/24 08:54  Resp 16 12/19/24 08:54  SpO2 95 % 12/19/24 08:54    Last Pain:  Vitals:   12/19/24 0854  TempSrc: Oral  PainSc: 0-No pain         Complications: No notable events documented.

## 2024-12-19 NOTE — Op Note (Signed)
 [facility] Patient Name: Logan Hodge Procedure Date: 12/19/2024 8:26 AM MRN: [id number] Date of Birth: May 04, 1966 Attending MD: Carlin POUR. Cindie , [place], [id number] CSN: [id number] Age: 59 Admit Type: Outpatient Procedure:                Colonoscopy Indications:              Surveillance: Personal history of colonic polyps                            (unknown histology) on last colonoscopy more than 5                            years ago Providers:                Carlin POUR. Cindie, DO, Jon LABOR. Gerome RN, RN,                            Olam Ada, RN, Harlene Rung RRT, Technician Referring MD:              Medicines:                See the Anesthesia note for documentation of the                            administered medications Complications:            No immediate complications. Estimated Blood Loss:     Estimated blood loss was minimal. Procedure:                Pre-Anesthesia Assessment:                           - The anesthesia plan was to use monitored                            anesthesia care (MAC).                           After obtaining informed consent, the colonoscope                            was passed under direct vision. Throughout the                            procedure, the patient's blood pressure, pulse, and                            oxygen saturations were monitored continuously. The                            [id number]-HQ190L ([id number]) Peds Colon was introduced                            through the anus and advanced to the the cecum,                            identified  by appendiceal orifice and ileocecal                            valve. The colonoscopy was performed without                            difficulty. The patient tolerated the procedure                            well. The quality of the bowel preparation was                            evaluated using the BBPS [facility] Bowel Preparation                            Scale) with scores of: Right  Colon = 3, Transverse                            Colon = 3 and Left Colon = 3 (entire mucosa seen                            well with no residual staining, small fragments of                            stool or opaque liquid). The total BBPS score                            equals 9. Scope In: 8:36:37 AM Scope Out: 8:50:46 AM Scope Withdrawal Time: 0 hours 12 minutes 35 seconds  Total Procedure Duration: 0 hours 14 minutes 9 seconds  Findings:      Non-bleeding internal hemorrhoids were found.      Multiple large-mouthed and small-mouthed diverticula were found in the       sigmoid colon and descending colon.      Two sessile polyps were found in the sigmoid colon. The polyps were 3 to       5 mm in size. These polyps were removed with a cold snare. Resection and       retrieval were complete.      The exam was otherwise without abnormality. Impression:               - Non-bleeding internal hemorrhoids.                           - Diverticulosis in the sigmoid colon and in the                            descending colon.                           - Two 3 to 5 mm polyps in the sigmoid colon,                            removed with a cold snare. Resected and retrieved.                           -  The examination was otherwise normal. Moderate Sedation:      Per Anesthesia Care Recommendation:           - Patient has a contact number available for                            emergencies. The signs and symptoms of potential                            delayed complications were discussed with the                            patient. Return to normal activities tomorrow.                            Written discharge instructions were provided to the                            patient.                           - Resume previous diet.                           - Continue present medications.                           - Await pathology results.                           - Repeat colonoscopy  in 7-10 years depending on                            pathology results for surveillance.                           - Return to GI clinic PRN. Procedure Code(s):        --- Professional ---                           [id number], Colonoscopy, flexible; with removal of                            tumor(s), polyp(s), or other lesion(s) by snare                            technique Diagnosis Code(s):        --- Professional ---                           Z86.010, Personal history of colonic polyps                           K64.8, Other hemorrhoids                           D12.5, Benign neoplasm of sigmoid colon  K57.30, Diverticulosis of large intestine without                            perforation or abscess without bleeding CPT copyright 2022 American Medical Association. All rights reserved. The codes documented in this report are preliminary and upon coder review may  be revised to meet current compliance requirements. Carlin POUR. Cindie, DO Carlin POUR. Cindie, DO 12/19/2024 8:54:22 AM This report has been signed electronically. Number of Addenda: 0

## 2024-12-19 NOTE — Anesthesia Preprocedure Evaluation (Signed)
"                                    Anesthesia Evaluation  Patient identified by MRN, date of birth, ID band Patient awake    Reviewed: Allergy & Precautions, H&P , NPO status , Patient's Chart, lab work & pertinent test results, reviewed documented beta blocker date and time   Airway Mallampati: II  TM Distance: >3 FB Neck ROM: full    Dental no notable dental hx.    Pulmonary pneumonia   Pulmonary exam normal breath sounds clear to auscultation       Cardiovascular Exercise Tolerance: Good hypertension,  Rhythm:regular Rate:Normal     Neuro/Psych  Neuromuscular disease  negative psych ROS   GI/Hepatic Neg liver ROS, PUD,GERD  ,,  Endo/Other  negative endocrine ROS    Renal/GU negative Renal ROS  negative genitourinary   Musculoskeletal   Abdominal   Peds  Hematology negative hematology ROS (+)   Anesthesia Other Findings   Reproductive/Obstetrics negative OB ROS                              Anesthesia Physical Anesthesia Plan  ASA: 2  Anesthesia Plan: MAC   Post-op Pain Management:    Induction:   PONV Risk Score and Plan: Propofol  infusion  Airway Management Planned:   Additional Equipment:   Intra-op Plan:   Post-operative Plan:   Informed Consent: I have reviewed the patients History and Physical, chart, labs and discussed the procedure including the risks, benefits and alternatives for the proposed anesthesia with the patient or authorized representative who has indicated his/her understanding and acceptance.     Dental Advisory Given  Plan Discussed with: CRNA  Anesthesia Plan Comments:         Anesthesia Quick Evaluation  "

## 2024-12-22 LAB — SURGICAL PATHOLOGY

## 2024-12-23 ENCOUNTER — Encounter (HOSPITAL_COMMUNITY): Payer: Self-pay | Admitting: Internal Medicine

## 2024-12-26 ENCOUNTER — Ambulatory Visit: Payer: Self-pay | Admitting: Internal Medicine

## 2024-12-30 NOTE — Progress Notes (Signed)
 10 yr TCS noted in recall Patient result letter sent thru mychart procedure note and pathology result faxed to PCP
